# Patient Record
Sex: Male | Born: 1945 | Race: Black or African American | Hispanic: No | Marital: Married | Smoking: Never smoker
Health system: Southern US, Community
[De-identification: ages and names within clinical notes are randomized; demographics above are authoritative.]

## PROBLEM LIST (undated history)

## (undated) DIAGNOSIS — G47419 Narcolepsy without cataplexy: Secondary | ICD-10-CM

## (undated) DIAGNOSIS — Z9989 Dependence on other enabling machines and devices: Secondary | ICD-10-CM

## (undated) DIAGNOSIS — M1990 Unspecified osteoarthritis, unspecified site: Secondary | ICD-10-CM

## (undated) DIAGNOSIS — G4733 Obstructive sleep apnea (adult) (pediatric): Secondary | ICD-10-CM

## (undated) HISTORY — DX: Narcolepsy without cataplexy: G47.419

## (undated) HISTORY — DX: Obstructive sleep apnea (adult) (pediatric): G47.33

## (undated) HISTORY — PX: TONSILLECTOMY: SUR1361

## (undated) HISTORY — DX: Unspecified osteoarthritis, unspecified site: M19.90

## (undated) HISTORY — DX: Dependence on other enabling machines and devices: Z99.89

---

## 2007-06-09 DIAGNOSIS — G479 Sleep disorder, unspecified: Secondary | ICD-10-CM | POA: Insufficient documentation

## 2007-06-09 DIAGNOSIS — G47419 Narcolepsy without cataplexy: Secondary | ICD-10-CM | POA: Insufficient documentation

## 2007-06-09 DIAGNOSIS — G4733 Obstructive sleep apnea (adult) (pediatric): Secondary | ICD-10-CM | POA: Insufficient documentation

## 2007-06-10 ENCOUNTER — Ambulatory Visit: Payer: Self-pay | Admitting: Internal Medicine

## 2007-06-10 DIAGNOSIS — G4730 Sleep apnea, unspecified: Secondary | ICD-10-CM | POA: Insufficient documentation

## 2007-06-16 ENCOUNTER — Telehealth (INDEPENDENT_AMBULATORY_CARE_PROVIDER_SITE_OTHER): Payer: Self-pay | Admitting: *Deleted

## 2008-09-08 ENCOUNTER — Encounter: Payer: Self-pay | Admitting: Internal Medicine

## 2008-11-09 ENCOUNTER — Ambulatory Visit: Payer: Self-pay | Admitting: Internal Medicine

## 2008-12-22 ENCOUNTER — Telehealth: Payer: Self-pay | Admitting: Internal Medicine

## 2009-11-08 ENCOUNTER — Ambulatory Visit: Payer: Self-pay | Admitting: Internal Medicine

## 2010-05-31 NOTE — Assessment & Plan Note (Signed)
Summary: F/U 1 YR ////KP   Primary Provider/Referring Provider:  none  CC:  1 year follow up , pt has not used cpap for the last 6 months, wakes every 2 hrs at night, and states cpap was not helping.  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 Had sleep apnea dx'd 10/98 with NPSG AHI 18/hr. Uses Cpap autotitration. 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 Very irregular sleep habits. Used to have more sleep paralysis and cataplexy, but these have improved with time. Never tried Xyrem. Has  been on Disability, having lost many jobs. Always very irregular sleep schedule despite counseling on this. We tried a number of alerting meds but he said none of them ever seemed to work.  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 He can predict sleepy and alert times of day and he chooses when to drive so as to work around this.   11/09/08- OSA, narcolepsy w/o cataplexy Uses cpap if room is warm.. In the past he had failed to benefit from stimulant meds. He doesn't drive if he feels sleepy. Says mask is comfortable. Gets some care through [place]. Denies hx of epilepsy/ seizure, head trauma or stroke. Brngs DOT form- he denies sleepiness while driving. Will take occasional nap at home.  November 08, 2009- OSA, narcolepsy w/o cataplexy He comes for f/u saying he doesn't drive if he feels sleepy. He says he stopped CPAP 6 months ago, first saying it didn't help, then that he didn't really remember why he couldn't wear CPAP. His wife told him it did reduce snoring. He dropped off Nuvigil. He says he sits and "meditates" meaning he waits til alert enough to go forward. 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 I suggesested again that he go talk about  disability with [employer].He wakes for stretches of insomnia at night and continues with the irregular sleep habits he has always had.  Preventive Screening-Counseling & Management  Alcohol-Tobacco     Smoking Status: never  Current Medications (verified): 1)  Cpap 4 Advanced 2)  Ibuprofen 800 Mg Tabs (Ibuprofen) .... Take One Tablet By Mouth Once Daily 3)  Nuvigil 250 Mg Tabs (Armodafinil) .Marland Kitchen.. 1 Daily As Needed 4)  Vitamin B-12 100 Mcg Tabs (Cyanocobalamin) .Marland Kitchen.. 1 Every Other Day  Allergies (verified): No Known Drug Allergies  Past History:  Past Surgical History: Last updated: 11/09/2008 Tonsillectomy  Family History: Last updated: 06/10/2007 Father-cancer ? type Brother-prostate cancer  Social History: Last updated: 06/10/2007 Patient never smoked. Occassionally puffs on a cigar. Pt is married with children. Pt is a Proofreader by occupation.  Risk Factors: Smoking Status: never (11/08/2009)  Past Medical History: Sleep Apnea Narcolepsy w/o  cataplexy  Review of Systems      See HPI  The patient denies anorexia, fever, weight loss, weight gain, vision loss, decreased hearing, hoarseness, chest pain, syncope, dyspnea on exertion, peripheral edema, prolonged cough, headaches, hemoptysis, abdominal pain, melena, hematochezia, and severe indigestion/heartburn.    Vital Signs:  Patient profile:   65 year old male Height:      74 inches Weight:      359 pounds BMI:     46.26 O2 Sat:      92 % on Room air Pulse rate:   62 / minute BP sitting:  120 / 84  (left arm) Cuff size:   large  Vitals Entered By: Renold Genta RCP, LPN (November 08, 2009 10:14 AM)  O2 Sat at Rest %:  92% O2 Flow:  Room air CC: 1 year follow up , pt has not used cpap for the last 6 months, wakes every 2 hrs at night, states cpap was not helping Comments Medications reviewed with patient Renold Genta RCP, LPN  November 08, 2009 10:22 AM    Physical Exam  Additional Exam:  General:  A/Ox3; pleasant and cooperative, NAD, awake, interactive, casual but not sleepy once i walked into the room and started talking with him. SKIN: no rash, lesions NODES: no lymphadenopathy HEENT: [place]/AT, EOM- WNL, Conjuctivae- clear, PERRLA, TM-WNL, Nose- clear, Throat- clear and wnl, Mallampati III-IV, dental repair NECK: Supple w/ fair ROM, JVD- none, normal carotid impulses w/o bruits Thyroid- normal to palpation CHEST: Clear to P&A HEART: RRR, no m/g/r heard ABDOMEN: Soft and nl; , overweight JXB:JYNW, nl pulses, no edema  NEURO: Grossly intact to observation. No restlessness or tremor.      Impression & Recommendations:  Problem # 1:  HYPERSOMNIA, ASSOCIATED WITH SLEEP APNEA (ICD-780.53)  I pressed him to resume use of CPAP on a regular basis. He admits it reduces or stops snoring. He reports a speeding ticket but no other traffic violations.  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 We are going to give samples again for Nuvigil.  Medications Added to Medication List This Visit: 1)  Vitamin B-12 100 Mcg Tabs (Cyanocobalamin) .Marland Kitchen.. 1 every other day  Other Orders: Est. Patient Level III (29562)  Patient Instructions: 1)  Please schedule a follow-up appointment in 1 year. 2)  Please be very careful with your driving and if you aren't alert enough, then don't drive. 3)  I think you should be using your CPAP. if it isn't comfortable, please let us know and we will get it worked on for you. 4)  Try samples of Nuvigil 250 mg, once daily as an alerting med to wake you up. If it helps, then we can get you a script. 5)  Ask the Social Security office about disability.

## 2010-11-08 ENCOUNTER — Ambulatory Visit: Payer: Self-pay | Admitting: Internal Medicine

## 2011-06-05 ENCOUNTER — Emergency Department (INDEPENDENT_AMBULATORY_CARE_PROVIDER_SITE_OTHER): Payer: Self-pay

## 2011-06-05 ENCOUNTER — Encounter (HOSPITAL_COMMUNITY): Payer: Self-pay

## 2011-06-05 ENCOUNTER — Emergency Department (HOSPITAL_COMMUNITY): Payer: Self-pay

## 2011-06-05 ENCOUNTER — Ambulatory Visit (HOSPITAL_COMMUNITY)
Admission: EM | Admit: 2011-06-05 | Discharge: 2011-06-06 | Disposition: A | Discharge status: Home / Self Care | Payer: Self-pay | Attending: Orthopedic Surgery | Admitting: Orthopedic Surgery

## 2011-06-05 ENCOUNTER — Other Ambulatory Visit: Payer: Self-pay

## 2011-06-05 ENCOUNTER — Emergency Department (INDEPENDENT_AMBULATORY_CARE_PROVIDER_SITE_OTHER)
Admission: EM | Admit: 2011-06-05 | Discharge: 2011-06-05 | Disposition: A | Discharge status: Other Acute Inpatient Hospital | Payer: Self-pay | Source: Home / Self Care | Attending: Emergency Medicine | Admitting: Emergency Medicine

## 2011-06-05 ENCOUNTER — Encounter (HOSPITAL_COMMUNITY): Payer: Self-pay | Admitting: Anesthesiology

## 2011-06-05 ENCOUNTER — Encounter (HOSPITAL_COMMUNITY): Payer: Self-pay | Admitting: *Deleted

## 2011-06-05 ENCOUNTER — Encounter (HOSPITAL_COMMUNITY): Admission: EM | Disposition: A | Discharge status: Home / Self Care | Payer: Self-pay | Source: Home / Self Care

## 2011-06-05 ENCOUNTER — Emergency Department (HOSPITAL_COMMUNITY): Payer: Self-pay | Admitting: Anesthesiology

## 2011-06-05 DIAGNOSIS — S62639A Displaced fracture of distal phalanx of unspecified finger, initial encounter for closed fracture: Secondary | ICD-10-CM

## 2011-06-05 DIAGNOSIS — G4730 Sleep apnea, unspecified: Secondary | ICD-10-CM | POA: Insufficient documentation

## 2011-06-05 DIAGNOSIS — W298XXA Contact with other powered powered hand tools and household machinery, initial encounter: Secondary | ICD-10-CM | POA: Insufficient documentation

## 2011-06-05 DIAGNOSIS — S61411A Laceration without foreign body of right hand, initial encounter: Secondary | ICD-10-CM

## 2011-06-05 DIAGNOSIS — Y92009 Unspecified place in unspecified non-institutional (private) residence as the place of occurrence of the external cause: Secondary | ICD-10-CM | POA: Insufficient documentation

## 2011-06-05 DIAGNOSIS — S61409A Unspecified open wound of unspecified hand, initial encounter: Secondary | ICD-10-CM

## 2011-06-05 DIAGNOSIS — S61209A Unspecified open wound of unspecified finger without damage to nail, initial encounter: Secondary | ICD-10-CM | POA: Insufficient documentation

## 2011-06-05 DIAGNOSIS — G4733 Obstructive sleep apnea (adult) (pediatric): Secondary | ICD-10-CM | POA: Insufficient documentation

## 2011-06-05 DIAGNOSIS — S62639B Displaced fracture of distal phalanx of unspecified finger, initial encounter for open fracture: Secondary | ICD-10-CM | POA: Insufficient documentation

## 2011-06-05 DIAGNOSIS — Y998 Other external cause status: Secondary | ICD-10-CM | POA: Insufficient documentation

## 2011-06-05 DIAGNOSIS — IMO0002 Reserved for concepts with insufficient information to code with codable children: Secondary | ICD-10-CM | POA: Insufficient documentation

## 2011-06-05 DIAGNOSIS — G47419 Narcolepsy without cataplexy: Secondary | ICD-10-CM | POA: Insufficient documentation

## 2011-06-05 HISTORY — PX: ORIF FINGER FRACTURE: SHX2122

## 2011-06-05 HISTORY — PX: I & D EXTREMITY: SHX5045

## 2011-06-05 LAB — DIFFERENTIAL
Eosinophils Relative: 1 % (ref 0–5)
Lymphs Abs: 2.9 10*3/uL (ref 0.7–4.0)
Monocytes Absolute: 0.9 10*3/uL (ref 0.1–1.0)
Neutro Abs: 5.8 10*3/uL (ref 1.7–7.7)

## 2011-06-05 LAB — CBC
MCH: 22.9 pg — ABNORMAL LOW (ref 26.0–34.0)
MCV: 69.1 fL — ABNORMAL LOW (ref 78.0–100.0)
Platelets: 264 10*3/uL (ref 150–400)
RBC: 5.76 MIL/uL (ref 4.22–5.81)

## 2011-06-05 SURGERY — IRRIGATION AND DEBRIDEMENT EXTREMITY
Anesthesia: Regional | Site: Hand | Laterality: Right | Wound class: Contaminated

## 2011-06-05 MED ORDER — ONDANSETRON HCL 4 MG/2ML IJ SOLN
INTRAMUSCULAR | Status: DC | PRN
  Administered 2011-06-05: 4 mg via INTRAVENOUS

## 2011-06-05 MED ORDER — MIDAZOLAM HCL 2 MG/2ML IJ SOLN: 0.5000 mg | INTRAMUSCULAR | Status: DC | PRN

## 2011-06-05 MED ORDER — 0.9 % SODIUM CHLORIDE (POUR BTL) OPTIME
TOPICAL | Status: DC | PRN
  Administered 2011-06-05: 1000 mL

## 2011-06-05 MED ORDER — OXYCODONE HCL 5 MG PO TABS
5.0000 mg | ORAL_TABLET | ORAL | Status: DC | PRN
  Administered 2011-06-06: 10 mg via ORAL
  Filled 2011-06-05: qty 2

## 2011-06-05 MED ORDER — CEPHALEXIN 500 MG PO CAPS: 500.0000 mg | ORAL_CAPSULE | Freq: Four times a day (QID) | ORAL | Status: AC

## 2011-06-05 MED ORDER — ONDANSETRON HCL 4 MG/2ML IJ SOLN: 4.0000 mg | Freq: Four times a day (QID) | INTRAMUSCULAR | Status: DC | PRN

## 2011-06-05 MED ORDER — METOCLOPRAMIDE HCL 5 MG/ML IJ SOLN: 10.0000 mg | Freq: Once | INTRAMUSCULAR | Status: DC | PRN

## 2011-06-05 MED ORDER — ROPIVACAINE HCL 5 MG/ML IJ SOLN
INTRAMUSCULAR | Status: DC | PRN
  Administered 2011-06-05: 35 mL via EPIDURAL

## 2011-06-05 MED ORDER — PHENYLEPHRINE HCL 10 MG/ML IJ SOLN
INTRAMUSCULAR | Status: DC | PRN
  Administered 2011-06-05 (×2): 80 ug via INTRAVENOUS
  Administered 2011-06-05: 120 ug via INTRAVENOUS

## 2011-06-05 MED ORDER — VITAMIN C 500 MG PO TABS: 500.0000 mg | ORAL_TABLET | Freq: Every day | ORAL | Status: AC

## 2011-06-05 MED ORDER — MODAFINIL 200 MG PO TABS
200.0000 mg | ORAL_TABLET | Freq: Every day | ORAL | Status: DC
  Administered 2011-06-06: 200 mg via ORAL
  Filled 2011-06-05: qty 1

## 2011-06-05 MED ORDER — TETANUS-DIPHTH-ACELL PERTUSSIS 5-2.5-18.5 LF-MCG/0.5 IM SUSP
0.5000 mL | Freq: Once | INTRAMUSCULAR | Status: AC
  Administered 2011-06-05: 0.5 mL via INTRAMUSCULAR

## 2011-06-05 MED ORDER — OXYCODONE-ACETAMINOPHEN 10-325 MG PO TABS: 1.0000 | ORAL_TABLET | ORAL | Status: AC | PRN

## 2011-06-05 MED ORDER — HYDROCODONE-ACETAMINOPHEN 5-325 MG PO TABS
1.0000 | ORAL_TABLET | ORAL | Status: DC | PRN
  Administered 2011-06-06: 2 via ORAL
  Filled 2011-06-05: qty 2

## 2011-06-05 MED ORDER — LIDOCAINE HCL 1 % IJ SOLN
INTRAMUSCULAR | Status: DC | PRN
  Administered 2011-06-05: 2 mL via INTRADERMAL

## 2011-06-05 MED ORDER — TETANUS-DIPHTH-ACELL PERTUSSIS 5-2.5-18.5 LF-MCG/0.5 IM SUSP
INTRAMUSCULAR | Status: AC
  Filled 2011-06-05: qty 0.5

## 2011-06-05 MED ORDER — CEFTRIAXONE SODIUM 1 G IJ SOLR
1.0000 g | Freq: Once | INTRAMUSCULAR | Status: AC
  Administered 2011-06-05: 1 g via INTRAMUSCULAR

## 2011-06-05 MED ORDER — CEFTRIAXONE SODIUM 1 G IJ SOLR
INTRAMUSCULAR | Status: AC
  Filled 2011-06-05: qty 10

## 2011-06-05 MED ORDER — DOCUSATE SODIUM 100 MG PO CAPS
100.0000 mg | ORAL_CAPSULE | Freq: Two times a day (BID) | ORAL | Status: DC
  Administered 2011-06-06 (×2): 100 mg via ORAL
  Filled 2011-06-05 (×3): qty 1

## 2011-06-05 MED ORDER — KCL IN DEXTROSE-NACL 20-5-0.45 MEQ/L-%-% IV SOLN
INTRAVENOUS | Status: DC
  Administered 2011-06-05: 23:00:00 via INTRAVENOUS
  Filled 2011-06-05 (×2): qty 1000

## 2011-06-05 MED ORDER — VITAMIN C 500 MG PO TABS
1000.0000 mg | ORAL_TABLET | Freq: Every day | ORAL | Status: DC
  Administered 2011-06-06: 1000 mg via ORAL
  Filled 2011-06-05: qty 2

## 2011-06-05 MED ORDER — FENTANYL CITRATE 0.05 MG/ML IJ SOLN: 25.0000 ug | INTRAMUSCULAR | Status: DC | PRN

## 2011-06-05 MED ORDER — CEFAZOLIN SODIUM 1-5 GM-% IV SOLN
INTRAVENOUS | Status: AC
  Filled 2011-06-05: qty 100

## 2011-06-05 MED ORDER — CEFAZOLIN SODIUM 1-5 GM-% IV SOLN
1.0000 g | Freq: Three times a day (TID) | INTRAVENOUS | Status: DC
  Administered 2011-06-06 (×2): 1 g via INTRAVENOUS
  Filled 2011-06-05 (×4): qty 50

## 2011-06-05 MED ORDER — METHOCARBAMOL 100 MG/ML IJ SOLN: 500.0000 mg | Freq: Four times a day (QID) | INTRAMUSCULAR | Status: DC | PRN

## 2011-06-05 MED ORDER — LIDOCAINE HCL (PF) 1 % IJ SOLN
INTRAMUSCULAR | Status: AC
  Filled 2011-06-05: qty 5

## 2011-06-05 MED ORDER — VITAMIN B-12 100 MCG PO TABS
50.0000 ug | ORAL_TABLET | ORAL | Status: DC
  Administered 2011-06-06: 50 ug via ORAL
  Filled 2011-06-05: qty 1

## 2011-06-05 MED ORDER — EPHEDRINE SULFATE 50 MG/ML IJ SOLN
INTRAMUSCULAR | Status: DC | PRN
  Administered 2011-06-05 (×2): 10 mg via INTRAVENOUS

## 2011-06-05 MED ORDER — ONDANSETRON HCL 4 MG PO TABS: 4.0000 mg | ORAL_TABLET | Freq: Four times a day (QID) | ORAL | Status: DC | PRN

## 2011-06-05 MED ORDER — CEFAZOLIN SODIUM 1-5 GM-% IV SOLN
1.0000 g | INTRAVENOUS | Status: AC
  Administered 2011-06-06: 1 g via INTRAVENOUS
  Filled 2011-06-05: qty 50

## 2011-06-05 MED ORDER — ARMODAFINIL 250 MG PO TABS: 250.0000 mg | ORAL_TABLET | Freq: Every day | ORAL | Status: DC

## 2011-06-05 MED ORDER — MORPHINE SULFATE 4 MG/ML IJ SOLN: 0.0500 mg/kg | INTRAMUSCULAR | Status: DC | PRN

## 2011-06-05 MED ORDER — LACTATED RINGERS IV SOLN
INTRAVENOUS | Status: DC | PRN
  Administered 2011-06-05 (×2): via INTRAVENOUS

## 2011-06-05 MED ORDER — DIPHENHYDRAMINE HCL 25 MG PO CAPS: 25.0000 mg | ORAL_CAPSULE | Freq: Four times a day (QID) | ORAL | Status: DC | PRN

## 2011-06-05 MED ORDER — PROPOFOL 10 MG/ML IV EMUL
INTRAVENOUS | Status: DC | PRN
  Administered 2011-06-05: 100 mg via INTRAVENOUS
  Administered 2011-06-05: 200 mg via INTRAVENOUS

## 2011-06-05 MED ORDER — CEFAZOLIN SODIUM 1-5 GM-% IV SOLN
INTRAVENOUS | Status: DC | PRN
  Administered 2011-06-05: 2 g via INTRAVENOUS

## 2011-06-05 MED ORDER — ALPRAZOLAM 0.5 MG PO TABS: 0.5000 mg | ORAL_TABLET | Freq: Four times a day (QID) | ORAL | Status: DC | PRN

## 2011-06-05 MED ORDER — MORPHINE SULFATE 2 MG/ML IJ SOLN: 1.0000 mg | INTRAMUSCULAR | Status: DC | PRN

## 2011-06-05 MED ORDER — FENTANYL CITRATE 0.05 MG/ML IJ SOLN: 50.0000 ug | INTRAMUSCULAR | Status: DC | PRN

## 2011-06-05 MED ORDER — METHOCARBAMOL 500 MG PO TABS: 500.0000 mg | ORAL_TABLET | Freq: Four times a day (QID) | ORAL | Status: DC | PRN

## 2011-06-05 MED ORDER — METOCLOPRAMIDE HCL 5 MG/ML IJ SOLN
INTRAMUSCULAR | Status: DC | PRN
  Administered 2011-06-05: 20 mg via INTRAVENOUS

## 2011-06-05 SURGICAL SUPPLY — 61 items
BANDAGE CONFORM 2  STR LF (GAUZE/BANDAGES/DRESSINGS) ×2 IMPLANT
BANDAGE CONFORM 2X5YD N/S (GAUZE/BANDAGES/DRESSINGS) ×1 IMPLANT
BANDAGE ELASTIC 3 VELCRO ST LF (GAUZE/BANDAGES/DRESSINGS) ×1 IMPLANT
BANDAGE ELASTIC 4 VELCRO ST LF (GAUZE/BANDAGES/DRESSINGS) IMPLANT
BANDAGE GAUZE ELAST BULKY 4 IN (GAUZE/BANDAGES/DRESSINGS) IMPLANT
BNDG CMPR 9X4 STRL LF SNTH (GAUZE/BANDAGES/DRESSINGS) ×2
BNDG CMPR MD 5X2 ELC HKLP STRL (GAUZE/BANDAGES/DRESSINGS) ×2
BNDG COHESIVE 1X5 TAN STRL LF (GAUZE/BANDAGES/DRESSINGS) ×3 IMPLANT
BNDG ELASTIC 2 VLCR STRL LF (GAUZE/BANDAGES/DRESSINGS) ×3 IMPLANT
BNDG ESMARK 4X9 LF (GAUZE/BANDAGES/DRESSINGS) ×3 IMPLANT
CAP PIN ORTHO PINK (CAP) IMPLANT
CAP PIN PROTECTOR ORTHO WHT (CAP) IMPLANT
CLOTH BEACON ORANGE TIMEOUT ST (SAFETY) ×3 IMPLANT
CORDS BIPOLAR (ELECTRODE) ×3 IMPLANT
COVER SURGICAL LIGHT HANDLE (MISCELLANEOUS) ×3 IMPLANT
CUFF TOURNIQUET SINGLE 18IN (TOURNIQUET CUFF) ×2 IMPLANT
CUFF TOURNIQUET SINGLE 24IN (TOURNIQUET CUFF) ×1 IMPLANT
DRAPE OEC MINIVIEW 54X84 (DRAPES) ×1 IMPLANT
DRAPE SURG 17X23 STRL (DRAPES) ×3 IMPLANT
DRSG ADAPTIC 3X8 NADH LF (GAUZE/BANDAGES/DRESSINGS) ×1 IMPLANT
GAUZE SPONGE 2X2 8PLY STRL LF (GAUZE/BANDAGES/DRESSINGS) IMPLANT
GAUZE XEROFORM 1X8 LF (GAUZE/BANDAGES/DRESSINGS) ×3 IMPLANT
GLOVE BIOGEL PI IND STRL 8.5 (GLOVE) ×2 IMPLANT
GLOVE BIOGEL PI INDICATOR 8.5 (GLOVE) ×1
GLOVE BIOGEL PI ORTHO PRO SZ7 (GLOVE) ×1
GLOVE PI ORTHO PRO STRL SZ7 (GLOVE) IMPLANT
GLOVE SS BIOGEL STRL SZ 6.5 (GLOVE) IMPLANT
GLOVE SUPERSENSE BIOGEL SZ 6.5 (GLOVE) ×1
GLOVE SURG ORTHO 8.0 STRL STRW (GLOVE) ×3 IMPLANT
GOWN PREVENTION PLUS XLARGE (GOWN DISPOSABLE) ×3 IMPLANT
GOWN STRL NON-REIN LRG LVL3 (GOWN DISPOSABLE) ×6 IMPLANT
K-WIRE SMTH SNGL TROCAR .028X4 (WIRE)
KIT BASIN OR (CUSTOM PROCEDURE TRAY) ×3 IMPLANT
KIT ROOM TURNOVER OR (KITS) ×3 IMPLANT
KWIRE 4.0 X .045IN (WIRE) ×2 IMPLANT
KWIRE SMTH SNGL TROCAR .028X4 (WIRE) IMPLANT
MANIFOLD NEPTUNE II (INSTRUMENTS) ×3 IMPLANT
NDL HYPO 25GX1X1/2 BEV (NEEDLE) IMPLANT
NEEDLE HYPO 25GX1X1/2 BEV (NEEDLE) ×3 IMPLANT
NS IRRIG 1000ML POUR BTL (IV SOLUTION) ×3 IMPLANT
PACK ORTHO EXTREMITY (CUSTOM PROCEDURE TRAY) ×3 IMPLANT
PAD ARMBOARD 7.5X6 YLW CONV (MISCELLANEOUS) ×6 IMPLANT
PAD CAST 4YDX4 CTTN HI CHSV (CAST SUPPLIES) IMPLANT
PADDING CAST COTTON 4X4 STRL (CAST SUPPLIES) ×6
PADDING UNDERCAST 2  STERILE (CAST SUPPLIES) ×3 IMPLANT
SOAP 2 % CHG 4 OZ (WOUND CARE) ×3 IMPLANT
SPLINT FINGER W/BULB (SOFTGOODS) ×2 IMPLANT
SPONGE GAUZE 2X2 STER 10/PKG (GAUZE/BANDAGES/DRESSINGS)
SPONGE GAUZE 4X4 12PLY (GAUZE/BANDAGES/DRESSINGS) ×1 IMPLANT
SUCTION FRAZIER TIP 10 FR DISP (SUCTIONS) IMPLANT
SUT CHROMIC 5 0 P 3 (SUTURE) ×1 IMPLANT
SUT ETHIBOND 4 0 TF (SUTURE) ×1 IMPLANT
SUT MERSILENE 4 0 P 3 (SUTURE) IMPLANT
SUT PROLENE 4 0 PS 2 18 (SUTURE) ×3 IMPLANT
SUT PROLENE 5 0 P 3 (SUTURE) ×1 IMPLANT
SYR CONTROL 10ML LL (SYRINGE) IMPLANT
TOWEL OR 17X24 6PK STRL BLUE (TOWEL DISPOSABLE) ×3 IMPLANT
TOWEL OR 17X26 10 PK STRL BLUE (TOWEL DISPOSABLE) ×3 IMPLANT
TUBE CONNECTING 12X1/4 (SUCTIONS) IMPLANT
UNDERPAD 30X30 INCONTINENT (UNDERPADS AND DIAPERS) ×3 IMPLANT
WATER STERILE IRR 1000ML POUR (IV SOLUTION) ×3 IMPLANT

## 2011-06-05 NOTE — H&P (Signed)
Ian James is an 66 y.o. male.   Chief Complaint: RIGHT HAND CIRCULAR SAW INJURY HPI: PT AT HOME USING SAW SUSTAINED OPEN INJURY TO RIGHT HAND SEEN IN ED AND RECOMMENDED TO UNDERGO REPAIR OF COMPLEX LACERATIONS AND INJURIES TO HAND.   Past Medical History  Diagnosis Date  . OSA on CPAP   . Narcolepsy without cataplexy     Past Surgical History  Procedure Date  . Tonsillectomy     Family History  Problem Relation Age of Onset  . Cancer Father   . Prostate cancer Brother    Social History:  reports that he has never smoked. He does not have any smokeless tobacco history on file. His alcohol and drug histories not on file.  Allergies: No Known Allergies  Medications Prior to Admission  Medication Dose Route Frequency Provider Last Rate Last Dose  . cefTRIAXone (ROCEPHIN) injection 1 g  1 g Intramuscular Once Roque Lias, MD   1 g at 06/05/11 1231  . lactated ringers infusion    Continuous PRN Glendora Score, CRNA      . lidocaine (XYLOCAINE) 1 % (with pres) injection    PRN Constance Goltz, MD   2 mL at 06/05/11 1934  . ropivacaine (PF) (NAROPIN) injection    PRN Constance Goltz, MD   35 mL at 06/05/11 1937  . TDaP (BOOSTRIX) injection 0.5 mL  0.5 mL Intramuscular Once Roque Lias, MD   0.5 mL at 06/05/11 1125   Medications Prior to Admission  Medication Sig Dispense Refill  . Armodafinil (NUVIGIL) 250 MG tablet Take 250 mg by mouth daily.        Marland Kitchen ibuprofen (ADVIL,MOTRIN) 800 MG tablet Take 800 mg by mouth daily.        . vitamin B-12 (CYANOCOBALAMIN) 100 MCG tablet Take 50 mcg by mouth every other day.          Results for orders placed during the hospital encounter of 06/05/11 (from the past 48 hour(s))  CBC     Status: Abnormal   Collection Time   06/05/11  1:30 PM      Component Value Range Comment   WBC 9.8  4.0 - 10.5 (K/uL)    RBC 5.76  4.22 - 5.81 (MIL/uL)    Hemoglobin 13.2  13.0 - 17.0 (g/dL)    HCT 16.1  09.6 - 04.5 (%)    MCV 69.1 (*) 78.0 - 100.0 (fL)    MCH 22.9 (*) 26.0 - 34.0 (pg)    MCHC 33.2  30.0 - 36.0 (g/dL)    RDW 40.9 (*) 81.1 - 15.5 (%)    Platelets 264  150 - 400 (K/uL)   DIFFERENTIAL     Status: Normal   Collection Time   06/05/11  1:30 PM      Component Value Range Comment   Neutrophils Relative 59  43 - 77 (%)    Lymphocytes Relative 30  12 - 46 (%)    Monocytes Relative 9  3 - 12 (%)    Eosinophils Relative 1  0 - 5 (%)    Basophils Relative 1  0 - 1 (%)    Neutro Abs 5.8  1.7 - 7.7 (K/uL)    Lymphs Abs 2.9  0.7 - 4.0 (K/uL)    Monocytes Absolute 0.9  0.1 - 1.0 (K/uL)    Eosinophils Absolute 0.1  0.0 - 0.7 (K/uL)    Basophils Absolute 0.1  0.0 - 0.1 (K/uL)  RBC Morphology TARGET CELLS   TEARDROP CELLS   WBC Morphology ATYPICAL LYMPHOCYTES      Dg Chest 2 View  06/05/2011  *RADIOLOGY REPORT*  Clinical Data: Preop, laceration right hand injury  CHEST - 2 VIEW  Comparison: None.  Findings: Normal mediastinum and cardiac silhouette with ectatic aorta.  Chronic bronchitic markings centrally.  No effusion, infiltrate, pneumothorax. Degenerative osteophytosis of the thoracic spine.  IMPRESSION: No acute findings.  Bronchitic change.  Original Report Authenticated By: Genevive Bi, M.D.   Dg Hand Complete Right  06/05/2011  *RADIOLOGY REPORT*  Clinical Data: Cough, right index finger with a saw  RIGHT HAND - COMPLETE 3+ VIEW  Comparison: None  Findings: Osseous mineralization normal. Minimal scattered narrowing of interphalangeal joints. Displaced fracture through distal phalanx index finger with associated soft tissue deformity. Degenerative changes first CMC joint. Minimal degenerative changes at IP joint of the right thumb. Soft tissue irregularity at dorsal aspect right thumb at the level of the IP joint. Lucency is seen at the base of the distal phalanx right thumb though a definite distal phalangeal fracture is not identified; suspect lucency represents a prominent fossa at the base of the  distal phalanx, normal variant.  IMPRESSION: Displaced fracture and partial amputation through distal phalanx right index finger. No definite right thumb fracture identified.  Original Report Authenticated By: Lollie Marrow, M.D.    NO RECENT ILLNESSES  Blood pressure 147/92, pulse 90, temperature 98.2 F (36.8 C), temperature source Oral, resp. rate 14, height 6\' 2"  (1.88 m), weight 149.687 kg (330 lb), SpO2 97.00%. General Appearance:  Alert, cooperative, no distress, appears stated age  Head:  Normocephalic, without obvious abnormality, atraumatic  Eyes:  Pupils equal, conjunctiva/corneas clear,         Throat: Lips, mucosa, and tongue normal; teeth and gums normal  Neck: No visible masses     Lungs:   respirations unlabored  Chest Wall:  No tenderness or deformity  Heart:  Regular rate and rhythm,  Abdomen:   Soft, non-tender,         Extremities: RIGHT HAND IN BANDAGE NOT REMOVED IN PREOP AREA. PT' S HAND TO BE EXAMINED IN OR  Pulses: 2+ and symmetric  Skin: Skin color, texture, turgor normal, no rashes or lesions     Neurologic: Normal    Assessment/Plan RIGHT HAND CIRCULAR SAW INJURY WITH OPEN PHALANGEAL FRACTURES AND LACERATIONS  RIGHT HAND OPEN DEBRIDEMENT AND OPEN REDUCTION INTERNAL FIXATION REPAIR AS INDICATED  R/B/A DISCUSSED WITH PT IN HOLDING AREA.  PT VOICED UNDERSTANDING OF PLAN CONSENT SIGNED DAY OF SURGERY PT SEEN AND EXAMINED PRIOR TO OPERATIVE PROCEDURE/DAY OF SURGERY SITE MARKED. QUESTIONS ANSWERED WILL BE AN INPATIENT/OBS PATIENT FOLLOWING SURGERY  Sharma Covert 06/05/2011, 9:02 PM

## 2011-06-05 NOTE — Anesthesia Preprocedure Evaluation (Addendum)
Anesthesia Evaluation  Patient identified by MRN, date of birth, ID band Patient awake    Reviewed: Allergy & Precautions, H&P , NPO status , Patient's Chart, lab work & pertinent test results, reviewed documented beta blocker date and time   Airway Mallampati: II TM Distance: >3 FB Neck ROM: full    Dental  (+) Dental Advidsory Given   Pulmonary sleep apnea and Continuous Positive Airway Pressure Ventilation ,          Cardiovascular neg cardio ROS     Neuro/Psych Negative Neurological ROS  Negative Psych ROS   GI/Hepatic negative GI ROS, Neg liver ROS,   Endo/Other  Morbid obesity  Renal/GU negative Renal ROS  Genitourinary negative   Musculoskeletal   Abdominal   Peds  Hematology negative hematology ROS (+)   Anesthesia Other Findings See surgeon's H&P   Reproductive/Obstetrics negative OB ROS                          Anesthesia Physical Anesthesia Plan  ASA: III  Anesthesia Plan: General   Post-op Pain Management: MAC Combined w/ Regional for Post-op pain   Induction: Intravenous  Airway Management Planned: LMA  Additional Equipment:   Intra-op Plan:   Post-operative Plan:   Informed Consent: I have reviewed the patients History and Physical, chart, labs and discussed the procedure including the risks, benefits and alternatives for the proposed anesthesia with the patient or authorized representative who has indicated his/her understanding and acceptance.   Dental Advisory Given  Plan Discussed with: CRNA and Surgeon  Anesthesia Plan Comments:       Anesthesia Quick Evaluation

## 2011-06-05 NOTE — ED Provider Notes (Signed)
3:27 PM Pt seen and examined by me in CDU. Pt with laceration to right hand with a Skill Saw. Pt with bleeding to right hand. Here awaiting For Dr. Orlan Leavens. Pt states his pain is currently under control. He is in NAD. Right hand with gauze dressing, some bleeding seen through the dressing. Lungs clear to auscultation. Regular hr and rhythm. Will continue to monitor pt until taken to OR.  Lottie Mussel, PA 06/07/11 0020

## 2011-06-05 NOTE — ED Provider Notes (Signed)
Chief Complaint  Patient presents with  . Hand Injury    History of Present Illness:  The patient injured his right hand while working with a skill saw this morning at 10 AM. He has multiple lacerations involving his right hand, most notably the right index finger at the EIP joint, he also has a laceration over the dorsum of the CP joint of the thumb. He is able to extend the thumb, but he can't move the index finger at all. He also has a number of other superficial lacerations as well. He has no feeling in the finger he does have feeling in the thumb and all the other digits.  Review of Systems:  Other than noted above, the patient denies any of the following symptoms: Systemic:  No fevers, chills, sweats, or aches.  No fatigue or tiredness. Musculoskeletal:  No joint pain, arthritis, bursitis, swelling, back pain, or neck pain. Neurological:  No muscular weakness, paresthesias, headache, or trouble with speech or coordination.  No dizziness.   PMFSH:  Past medical history, family history, social history, meds, and allergies were reviewed.  Physical Exam:   Vital signs:  BP 172/97  Pulse 85  Temp(Src) 97.6 F (36.4 C) (Oral)  Resp 20  SpO2 99% Gen:  Alert and oriented times 3.  In no distress. Musculoskeletal: The laceration to the index finger is the most severe, this goes about three fourths of the way through the finger, involving the bone, and I can take the tip of the finger and moving around at will. He has no feeling in the tip of the finger. There is also a fairly deep laceration over the MCP joint of the thumb, but he is able to extend the thumb. He has a number other more superficial lacerations as well. Otherwise, all joints had a full a ROM with no swelling, bruising or deformity.  No edema, pulses full. Extremities were warm and pink.  Capillary refill was brisk.  Skin:  Clear, warm and dry.  No rash. Neuro:  Alert and oriented times 3.  Muscle strength was normal.  Sensation  was intact to light touch.   Labs:  No results found for this or any previous visit.   Radiology:  Dg Hand Complete Right  06/05/2011  *RADIOLOGY REPORT*  Clinical Data: Cough, right index finger with a saw  RIGHT HAND - COMPLETE 3+ VIEW  Comparison: None  Findings: Osseous mineralization normal. Minimal scattered narrowing of interphalangeal joints. Displaced fracture through distal phalanx index finger with associated soft tissue deformity. Degenerative changes first CMC joint. Minimal degenerative changes at IP joint of the right thumb. Soft tissue irregularity at dorsal aspect right thumb at the level of the IP joint. Lucency is seen at the base of the distal phalanx right thumb though a definite distal phalangeal fracture is not identified; suspect lucency represents a prominent fossa at the base of the distal phalanx, normal variant.  IMPRESSION: Displaced fracture and partial amputation through distal phalanx right index finger. No definite right thumb fracture identified.  Original Report Authenticated By: Lollie Marrow, M.D.    Medications given in UCC:  He was given a tetanus vaccine and Rocephin 1 g IM.  Assessment:   Diagnoses that have been ruled out:  None  Diagnoses that are still under consideration:  None  Final diagnoses:  Laceration of right hand with complication    Plan:   1.  the patient was sent by shuttle to the clinical decision unit at the  emergency department of the hospital. He'll be met there by Dr. Bradly Bienenstock who will handle his treatment from there.  Roque Lias, MD 06/05/11 1329

## 2011-06-05 NOTE — ED Notes (Addendum)
aprox 10:30 AM, pt was working w a skilll saw, when the saw kicked back n him, causing saw injury to all 5 fingers right hand (pt let handed). Superficial lact to 5 th finger, 3rd fingers; deep laceration to 4th finger.  Deep  Laceration to thumb, near complete amputation to index finger tip; minimal bleeding at present. States he had never had a saw kick back on him in all his yrs of use. No other injury noted

## 2011-06-05 NOTE — ED Provider Notes (Signed)
1:15 PM MSE. Pt to CDU while awaiting definitive care from Dr Melvyn Novas. Lacerations to all digits of right hand with a circular saw around 10:30 AM this morning. Patient is left-hand dominant. A&O x 3. No acute distress. Lungs clear to auscultation bilaterally. Heart with regular rate and rhythm without murmur. Abdomen is soft, nontender nondistended. There is a bulky dressing in place to the right hand. Superficial, blood soaked dressings are removed. There is gauze dressing over the effected digits that is stuck to the fingers with clotted blood-these are now removed to avoid disrupting any communication of the lacerated parts. A new dressing is placed by the nursing staff. Patient voices no needs at this time. Dr. Melvyn Novas notified of patient's arrival by CDU nursing staff.  Shaaron Adler, [place] 06/05/11 [phone]

## 2011-06-05 NOTE — ED Notes (Signed)
Pt will transfer to CDU where dr. Renetta Chalk will come to see him

## 2011-06-05 NOTE — ED Notes (Signed)
SPOKE WITH DR Melvyn Novas. HE DOES NOT WANT FURTHER ATB.

## 2011-06-05 NOTE — ED Notes (Signed)
Report called to cindy in or. She advises pt wife can come back with him to holding area

## 2011-06-05 NOTE — Anesthesia Procedure Notes (Addendum)
Anesthesia Regional Block:  Axillary brachial plexus block  Pre-Anesthetic Checklist: ,, timeout performed, Correct Patient, Correct Site, Correct Laterality, Correct Procedure, Correct Position, site marked, Risks and benefits discussed,  Surgical consent,  Pre-op evaluation,  At surgeon's request and post-op pain management  Laterality: Right  Prep: chloraprep       Needles:   Needle Type: Other   (Arrow Echogenic)   Needle Length: 9cm  Needle Gauge: 21    Additional Needles:  Procedures: ultrasound guided Axillary brachial plexus block Narrative:  Start time: 06/05/2011 7:33 PM End time: 06/05/2011 7:40 PM Injection made incrementally with aspirations every 5 mL.  Performed by: Personally  Anesthesiologist: Aldona Lento, MD  Additional Notes: Ultrasound guidance used to: id relevant anatomy, confirm needle position, local anesthetic spread, avoidance of vascular puncture. Picture saved. No complications. Block performed personally by Janetta Hora. Gelene Mink, MD    Interscalene brachial plexus block Procedure Name: LMA Insertion Date/Time: 06/05/2011 9:09 PM Performed by: Glendora Score Pre-anesthesia Checklist: Patient identified, Emergency Drugs available, Suction available and Patient being monitored Patient Re-evaluated:Patient Re-evaluated prior to inductionOxygen Delivery Method: Circle System Utilized Preoxygenation: Pre-oxygenation with 100% oxygen Intubation Type: IV induction LMA: LMA with gastric port inserted LMA Size: 5.0 Placement Confirmation: positive ETCO2 and breath sounds checked- equal and bilateral Tube secured with: Tape Dental Injury: Teeth and Oropharynx as per pre-operative assessment

## 2011-06-05 NOTE — Transfer of Care (Signed)
Immediate Anesthesia Transfer of Care Note  Patient: Ian James  Procedure(s) Performed:  OPEN REDUCTION INTERNAL FIXATION (ORIF) METACARPAL (FINGER) FRACTURE - ORIF Right Index Finger with Repair of Lacerations; IRRIGATION AND DEBRIDEMENT EXTREMITY  Patient Location: PACU  Anesthesia Type: General and Regional  Level of Consciousness: awake, alert , oriented and patient cooperative  Airway & Oxygen Therapy: Patient Spontanous Breathing and Patient connected to face mask oxygen  Post-op Assessment: Report given to PACU RN  Post vital signs: Reviewed and stable  Complications: No apparent anesthesia complications

## 2011-06-05 NOTE — ED Notes (Signed)
Pt sent from [facility] for surgery.  Pt has multiple deep hand lacerations to right hand from a injury this morning with a skills saw and was sent from [facility] to be seen by a hand surgeon (per pt report).  Pt has dressing to right hand.  Pt continues to have some bleeding which has soaked through dressing to right hand.

## 2011-06-05 NOTE — Preoperative (Signed)
Beta Blockers   Reason not to administer Beta Blockers:Not Applicable 

## 2011-06-05 NOTE — Anesthesia Postprocedure Evaluation (Signed)
Anesthesia Post Note  Patient: Ian James  Procedure(s) Performed:  OPEN REDUCTION INTERNAL FIXATION (ORIF) METACARPAL (FINGER) FRACTURE - ORIF Right Index Finger with Repair of Lacerations; IRRIGATION AND DEBRIDEMENT EXTREMITY  Anesthesia type: general  Patient location: PACU  Post pain: Pain level controlled  Post assessment: Patient's Cardiovascular Status Stable  Last Vitals:  Filed Vitals:   06/05/11 2200  BP:   Pulse: 87  Temp: 36.1 C  Resp: 15    Post vital signs: Reviewed and stable  Level of consciousness: sedated  Complications: No apparent anesthesia complications

## 2011-06-05 NOTE — Brief Op Note (Signed)
06/05/2011  10:19 PM  PATIENT:  Ian James  66 y.o. male  PRE-OPERATIVE DIAGNOSIS:  Right Hand Index Finger Injury  POST-OPERATIVE DIAGNOSIS:  Saw injury to multiple finger on right hand  PROCEDURE: I/D RIGHT THUMB AND INDEX AND ORIF RIGHT INDEX  SURGEON:  Surgeon(s): Sharma Covert, MD  PHYSICIAN ASSISTANT:   ASSISTANTS: none   ANESTHESIA:   general  EBL:  Total I/O In: 1800 [I.V.:1800] Out: -   BLOOD ADMINISTERED:none  DRAINS: none   LOCAL MEDICATIONS USED:  NONE  SPECIMEN:  No Specimen  DISPOSITION OF SPECIMEN:  N/A  COUNTS:  YES  TOURNIQUET:   Total Tourniquet Time Documented: Upper Arm (Right) - 29 minutes  DICTATION: .Other Dictation: Dictation Number [id number]  PLAN OF CARE: Admit for overnight observation  PATIENT DISPOSITION:  PACU - hemodynamically stable.   Delay start of Pharmacological VTE agent (>24hrs) due to surgical blood loss or risk of bleeding:  {YES/NO/NOT APPLICABLE:20182

## 2011-06-06 NOTE — Op Note (Signed)
Ian James, Ian James                      ACCOUNT NO.:  MEDICAL RECORD NO.:  [id number]  LOCATION:                                 FACILITY:  PHYSICIAN:  Madelynn Done, MD  DATE OF BIRTH:  1946-02-02  DATE OF PROCEDURE:  06/05/2011 DATE OF DISCHARGE:                              OPERATIVE REPORT   PREOPERATIVE DIAGNOSES: 1. Right thumb table saw injury. 2. Right index finger table saw injury with open distal phalanx     fracture. 3. Right ring finger table saw injuries with laceration.  POSTOPERATIVE DIAGNOSES: 1. Right thumb table saw injury. 2. Right index finger table saw injury with open distal phalanx     fracture. 3. Right ring finger table saw injuries with laceration.  ATTENDING PHYSICIAN:  Madelynn Done, MD, who scrubbed and present for the entire procedure.  ASSISTANT SURGEON:  None.  SURGICAL PROCEDURES: 1. Right thumb debridement of skin, subcutaneous tissue, and tendon     excisional debridement. 2. Right thumb extensor tendon repair. 3. Right index finger debridement of skin, subcutaneous tissue, and     bone associated with open fracture. 4. Right index finger open repair of distal phalangeal fracture with     internal fixation. 5. Right index finger complicated and traumatic laceration, 4 cm. 6. Right index finger nail bed repair. 7. Radiographs, 2 views, right index finger. 8. Right ring finger simple transverse laceration repair, less than 2     cm.  SURGICAL IMPLANTS:  One 0.045 K-wire.  SURGICAL INDICATIONS:  Mr. Mussa is a right-hand-dominant gentleman who sustained a table saw injury to his right dominant hand.  The patient is seen and evaluated and recommended to undergo the above procedure. Risks, benefits, and alternatives were discussed in detail with the patient and a signed informed consent was obtained.  Risks include but not limited to bleeding, infection, damage to nearby nerves, arteries, or tendons, loss of motion of the elbow,  wrist, and digits, and need for further surgical intervention.  DESCRIPTION OF PROCEDURE:  The patient was properly identified in the preop holding area and a mark with permanent marker was made on the right hand to indicate the correct operative site.  The patient was then brought back to the operating room and placed supine on anesthesia room table.  General anesthesia was administered.  The patient tolerated this well.  A well-padded tourniquet was then placed in the right brachium and sealed with 1000 drape.  Right upper extremity was then prepped and draped in a normal sterile fashion.  A time-out was called, the correct site was identified, and procedure was then begun.  Attention was then turned to the right hand where the thumb was then approached.  The sharp laceration extending over the IP joint was then extended both proximally and distally.  The excisional debridement of the skin, subcutaneous tissue, and tendon was then carried out removing all devitalized tissue. After excisional debridement was done sharply with a knife, the patient did have a partial laceration of the extensor tendon.  This laceration was then repaired with a simple 4-0 Ethibond figure-of-eight sutures. The wound was then  repaired.  After repairing the extensor tendon, the wound was then thoroughly irrigated and then laceration was then closed using simple Prolene sutures.  Attention was then turned to the ring finger where the patient did have a 2-cm simple laceration that was thoroughly irrigated.  Skin edges were debrided and laceration repaired with simple Prolene sutures.  The attention was then turned to the index finger where excisional debridement of the skin, subcutaneous tissue, and bone was then carried out of the open fracture.  The patient had a very distal tip injury.  The skin over the distal tip was being perfused through the radial neurovascular bundle.  Once the excisional debris  was then carried out sharply down with knifes, rongeur, and sharp scissors, a 0.045 K-wire was then placed antegrade and then back retrograde across the distal interphalangeal joint holding the fracture site well aligned. After internal fixation, the nail bed was then repaired with 5-0 chromic suture.  Following this, complicated and traumatic laceration was then repaired.  Skin flaps were then excised slightly and rotated and the skin was then closed using simple Prolene sutures.  The K-wire was then cut and bent and left out of the skin.  The Adaptic dressings were applied over the wounds.  Sterile compressive bandage was then applied. Final radiographs were obtained of the finger.  The patient was then put in a small finger dressing, extubated, and taken to the recovery room in good condition.  Intraoperative radiographs 2 views of the fingers do show the internal fixation in place and there was good position in both planes.  POSTOPERATIVE PLAN:  The patient is admitted for IV antibiotics and pain control, discharged in the morning, seen back in the office in approximately 6 days for wound check.  Tip protector splint for the index finger and gradually use to the long and the ring and the thumb. Sutures out at 2-week mark.  X-rays at each visit of the index finger. Pin out at the 4-week mark and then begin some active range of motion of the distal interphalangeal joint, the index finger at the 4-week mark.     Madelynn Done, MD     FWO/MEDQ  D:  06/05/2011  T:  06/06/2011  Job:  [id number]

## 2011-06-06 NOTE — ED Provider Notes (Signed)
Medical screening examination/treatment/procedure(s) were conducted as a shared visit with non-physician practitioner(s) and myself.  I personally evaluated the patient during the encounter  Shemeka Wardle T Prestyn Mahn, MD 06/06/11 0925 

## 2011-06-07 ENCOUNTER — Encounter (HOSPITAL_COMMUNITY): Payer: Self-pay | Admitting: Orthopedic Surgery

## 2011-06-07 NOTE — ED Provider Notes (Signed)
Medical screening examination/treatment/procedure(s) were performed by non-physician practitioner and as supervising physician I was immediately available for consultation/collaboration.  Gerhard Munch, MD 06/07/11 Jacinta Shoe

## 2012-05-14 ENCOUNTER — Encounter: Payer: Self-pay | Admitting: Internal Medicine

## 2012-05-14 ENCOUNTER — Ambulatory Visit (INDEPENDENT_AMBULATORY_CARE_PROVIDER_SITE_OTHER): Payer: Medicare Other | Admitting: Internal Medicine

## 2012-05-14 VITALS — BP 130/80 | HR 77 | Ht 74.0 in | Wt 358.2 lb

## 2012-05-14 DIAGNOSIS — G4733 Obstructive sleep apnea (adult) (pediatric): Secondary | ICD-10-CM

## 2012-05-14 DIAGNOSIS — G47419 Narcolepsy without cataplexy: Secondary | ICD-10-CM

## 2012-05-14 NOTE — Patient Instructions (Addendum)
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   Weight loss would help your sleep problems and your knees  Ok to continue modafanil through the [place]

## 2012-05-14 NOTE — Progress Notes (Signed)
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 FOLLOWS FOR: last seen 11-08-09;Retired now-Not using CPAP; patient states he never really sleeps-just catnapping at times. He does use CPAP/[facility] when is not bothered by nasal stuffiness and this varies. Provigil/ modafanil is prescribed through the [place], taking one half pill daily when needed.He uses this for driving to be sure he is alert. Episodes of sleepiness about once a day, lasting 2 or 3 minutes. Occasional sleep paralysis. No effect of caffeine. He works around intervals of sleepiness and is scrupulous about avoiding driving when he feels sleepy. Has not had cataplexy in many years. He brings a DOT form for driving continuity today and we discussed this carefully. He understands that I must take his assessment at face value and that it is his responsibility to drive safely or not drive at all.  ROS-see HPI Constitutional:   No-   weight loss, night sweats, fevers, chills, +fatigue, lassitude. HEENT:   No-  headaches, difficulty swallowing, tooth/dental problems, sore throat,       No-  sneezing, itching, ear ache, +nasal congestion, post nasal drip,  CV:  No-   chest pain, orthopnea, PND, swelling in lower extremities, anasarca,                                  dizziness, palpitations Resp: No-   shortness of breath with exertion or at rest.              No-   productive cough,  No non-productive cough,  No- coughing up of blood.              No-   change in color of mucus.  No- wheezing.   Skin: No-   rash or lesions. GI:  No-   heartburn, indigestion, abdominal pain, nausea, vomiting,  GU:  MS:  No-   joint pain or swelling.  . Neuro-     nothing unusual Psych:  No- change in mood or affect. No depression or anxiety.  No memory loss.  OBJ- Physical Exam General- Alert, Oriented, Affect-appropriate, Distress- none acute, obese, conversational Skin- rash-none, lesions- none, excoriation-  none Lymphadenopathy- none Head- atraumatic            Eyes- Gross vision intact, PERRLA, conjunctivae and secretions clear            Ears- Hearing, canals-normal            Nose- Clear, no-Septal dev, mucus, polyps, erosion, perforation             Throat- Mallampati III , mucosa clear , drainage- none, tonsils- atrophic Neck- flexible , trachea midline, no stridor , thyroid nl, carotid no bruit Chest - symmetrical excursion , unlabored           Heart/CV- RRR , no murmur , no gallop  , no rub, nl s1 s2                           - JVD- none , edema- none, stasis changes- none, varices- none           Lung- clear to P&A, wheeze- none, cough- none , dullness-none, rub- none           Chest wall-  Abd-  Br/ Gen/ Rectal- Not done, not indicated Extrem- cyanosis- none, clubbing, none, atrophy- none, strength- nl  Neuro- grossly intact to observation

## 2012-05-29 NOTE — Assessment & Plan Note (Signed)
Compliant with CPAP except with nasal congestion which we discussed. We reviewed the medical issues of untreated sleep apnea and some alternatives to CPAP. Reminded him of the importance of weight loss.

## 2012-05-29 NOTE — Assessment & Plan Note (Signed)
He understands sleep hygiene and the importance of strategic naps. He uses modafinil appropriately as a supplement to his naps. He is very careful about his driving and I reemphasized his responsibility again today.

## 2013-05-14 ENCOUNTER — Encounter: Payer: Self-pay | Admitting: Internal Medicine

## 2013-05-14 ENCOUNTER — Ambulatory Visit (INDEPENDENT_AMBULATORY_CARE_PROVIDER_SITE_OTHER): Payer: Medicare HMO | Admitting: Internal Medicine

## 2013-05-14 VITALS — BP 120/72 | HR 79 | Ht 74.0 in | Wt 354.6 lb

## 2013-05-14 DIAGNOSIS — G4733 Obstructive sleep apnea (adult) (pediatric): Secondary | ICD-10-CM

## 2013-05-14 DIAGNOSIS — G47419 Narcolepsy without cataplexy: Secondary | ICD-10-CM

## 2013-05-14 NOTE — Progress Notes (Signed)
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 FOLLOWS FOR: last seen 11-08-09;Retired now-Not using CPAP; patient states he never really sleeps-just catnapping at times. He does use CPAP/[facility] when is not bothered by nasal stuffiness and this varies. Provigil/ modafanil is prescribed through the [place], taking one half pill daily when needed.He uses this for driving to be sure he is alert. Episodes of sleepiness about once a day, lasting 2 or 3 minutes. Occasional sleep paralysis. No effect of caffeine. He works around intervals of sleepiness and is scrupulous about avoiding driving when he feels sleepy. Has not had cataplexy in many years. He brings a DOT form for driving continuity today and we discussed this carefully. He understands that I must take his assessment at face value and that it is his responsibility to drive safely or not drive at all.  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 FOLLOWS FOR: has started using CPAP through [facility] about 1 night a week-ends up taking if off around 3am -4am. [facility] taking one half tablet (? 200 mg tab) every other day Feels better. Considers himself retired. "Meditates" daily  ROS-see HPI Constitutional:   No-   weight loss, night sweats, fevers, chills, +fatigue, lassitude. HEENT:   No-  headaches, difficulty swallowing, tooth/dental problems, sore throat,       No-  sneezing, itching, ear ache, +nasal congestion, post nasal drip,  CV:  No-   chest pain, orthopnea, PND, swelling in lower extremities, anasarca,                                  dizziness, palpitations Resp: No-   shortness of breath with exertion or at rest.              No-   productive cough,  No non-productive cough,  No- coughing up of blood.              No-   change in color of mucus.  No- wheezing.   Skin: No-   rash or lesions. GI:  No-   heartburn, indigestion,  abdominal pain, nausea, vomiting,  GU:  MS:  No-   joint pain or swelling.  . Neuro-     nothing unusual Psych:  No- change in mood or affect. No depression or anxiety.  No memory loss.  OBJ- Physical Exam General- Alert, Oriented, Affect-appropriate, Distress- none acute, obese, conversational Skin- rash-none, lesions- none, excoriation- none Lymphadenopathy- none Head- atraumatic            Eyes- Gross vision intact, PERRLA, conjunctivae and secretions clear            Ears- Hearing, canals-normal            Nose- Clear, no-Septal dev, mucus, polyps, erosion, perforation             Throat- Mallampati III-IV , mucosa clear , drainage- none, tonsils- atrophic Neck- flexible , trachea midline, no stridor , thyroid nl, carotid no bruit Chest - symmetrical excursion , unlabored           Heart/CV- RRR , no murmur , no gallop  , no rub, nl s1 s2                           - JVD- none , edema- none, stasis  changes- none, varices- none           Lung- clear to P&A, wheeze- none, cough- none , dullness-none, rub- none           Chest wall-  Abd-  Br/ Gen/ Rectal- Not done, not indicated Extrem- cyanosis- none, clubbing, none, atrophy- none, strength- nl Neuro- grossly intact to observation

## 2013-05-14 NOTE — Patient Instructions (Signed)
Order- D[facility] - autotitrate CPAP 5-20 cwp x 7 days for pressure recommendation.    Dx OSA

## 2013-06-07 ENCOUNTER — Telehealth: Payer: Self-pay | Admitting: Internal Medicine

## 2013-06-07 NOTE — Telephone Encounter (Signed)
lmtcb for the pt x1

## 2013-06-08 NOTE — Telephone Encounter (Signed)
[facility] cell #, unable to leave message. [id number] on home # Carron CurieJennifer Castillo, CMA

## 2013-06-09 NOTE — Telephone Encounter (Signed)
I spoke with the Ian James and [facility]. I provided him the # for [facility]. He states he is going to call today to reschedule. Carron CurieJennifer Harlowe Dowler, CMA

## 2013-06-13 NOTE — Assessment & Plan Note (Signed)
Physical exam and history support second diagnosis of obstructive sleep apnea as previously demonstrated. Plan-[facility] D[facility] auto titrate CPAP for pressure recommendation. Encourage regular use.

## 2013-06-13 NOTE — Assessment & Plan Note (Addendum)
Chronic narcolepsy syndrome. Non-progressive. He has adjusted lifestyle rather than heavy use of medication.

## 2013-09-07 IMAGING — CR DG HAND COMPLETE 3+V*R*
3 series · 3 of 3 positions shown · non-contrast
Comparison: None

CLINICAL DATA: Cough, right index finger with a saw

RIGHT HAND - COMPLETE 3+ VIEW

[view not recorded (1 of 3)]
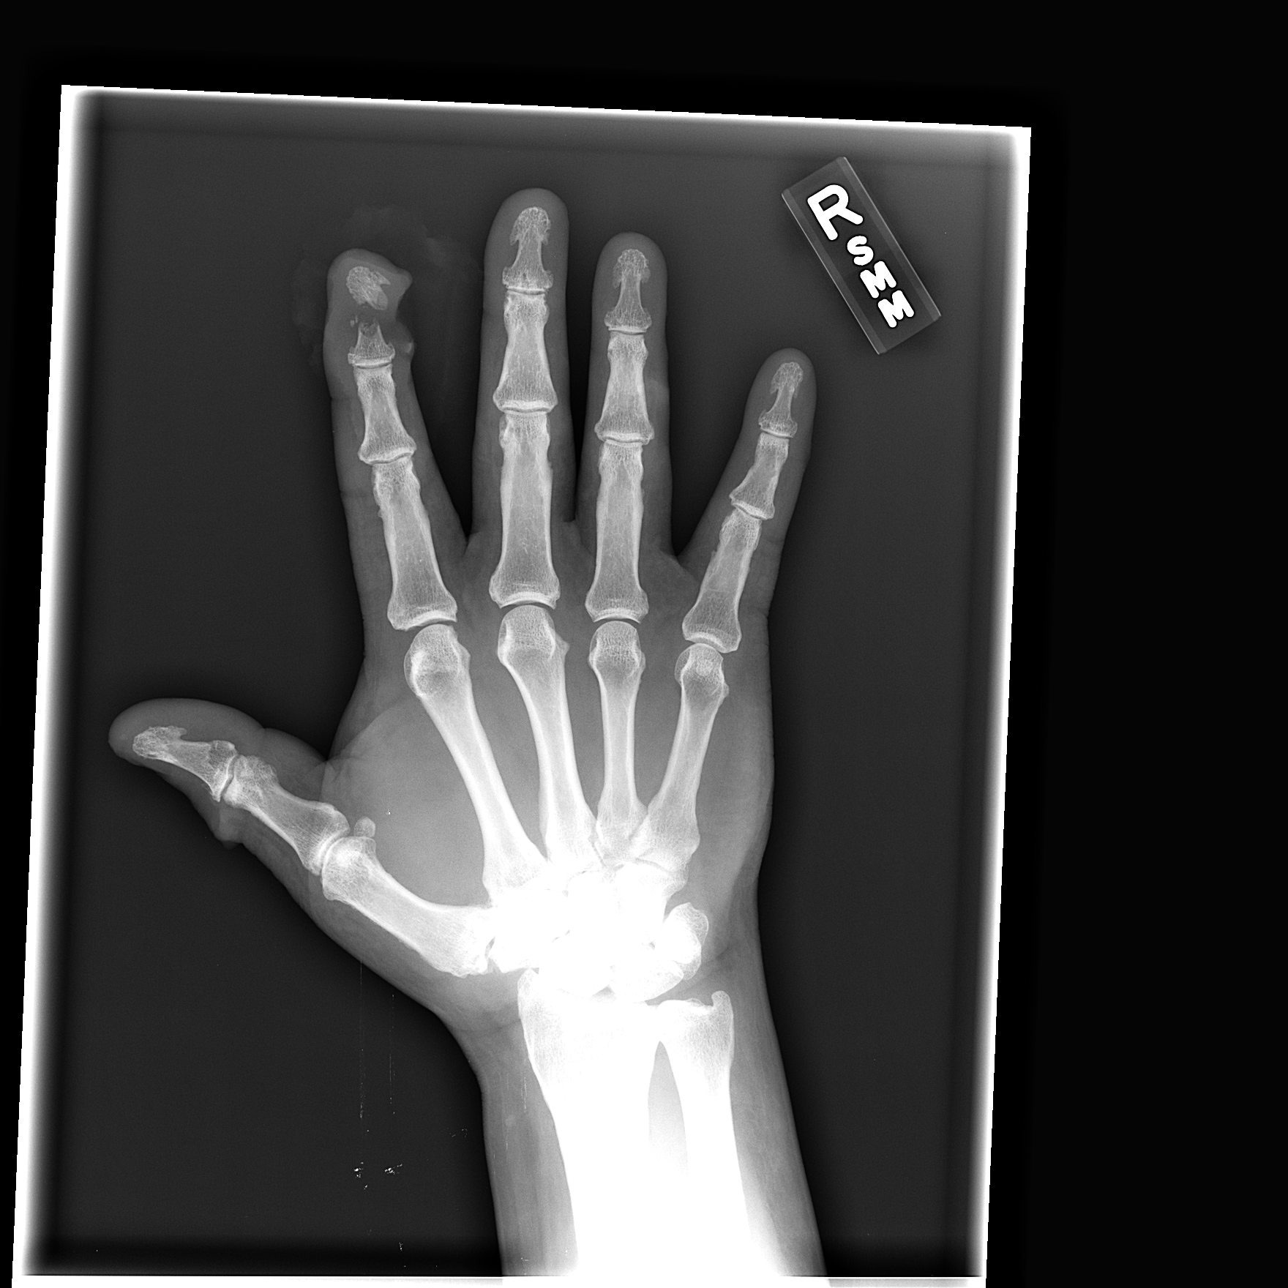

[view not recorded (2 of 3)]
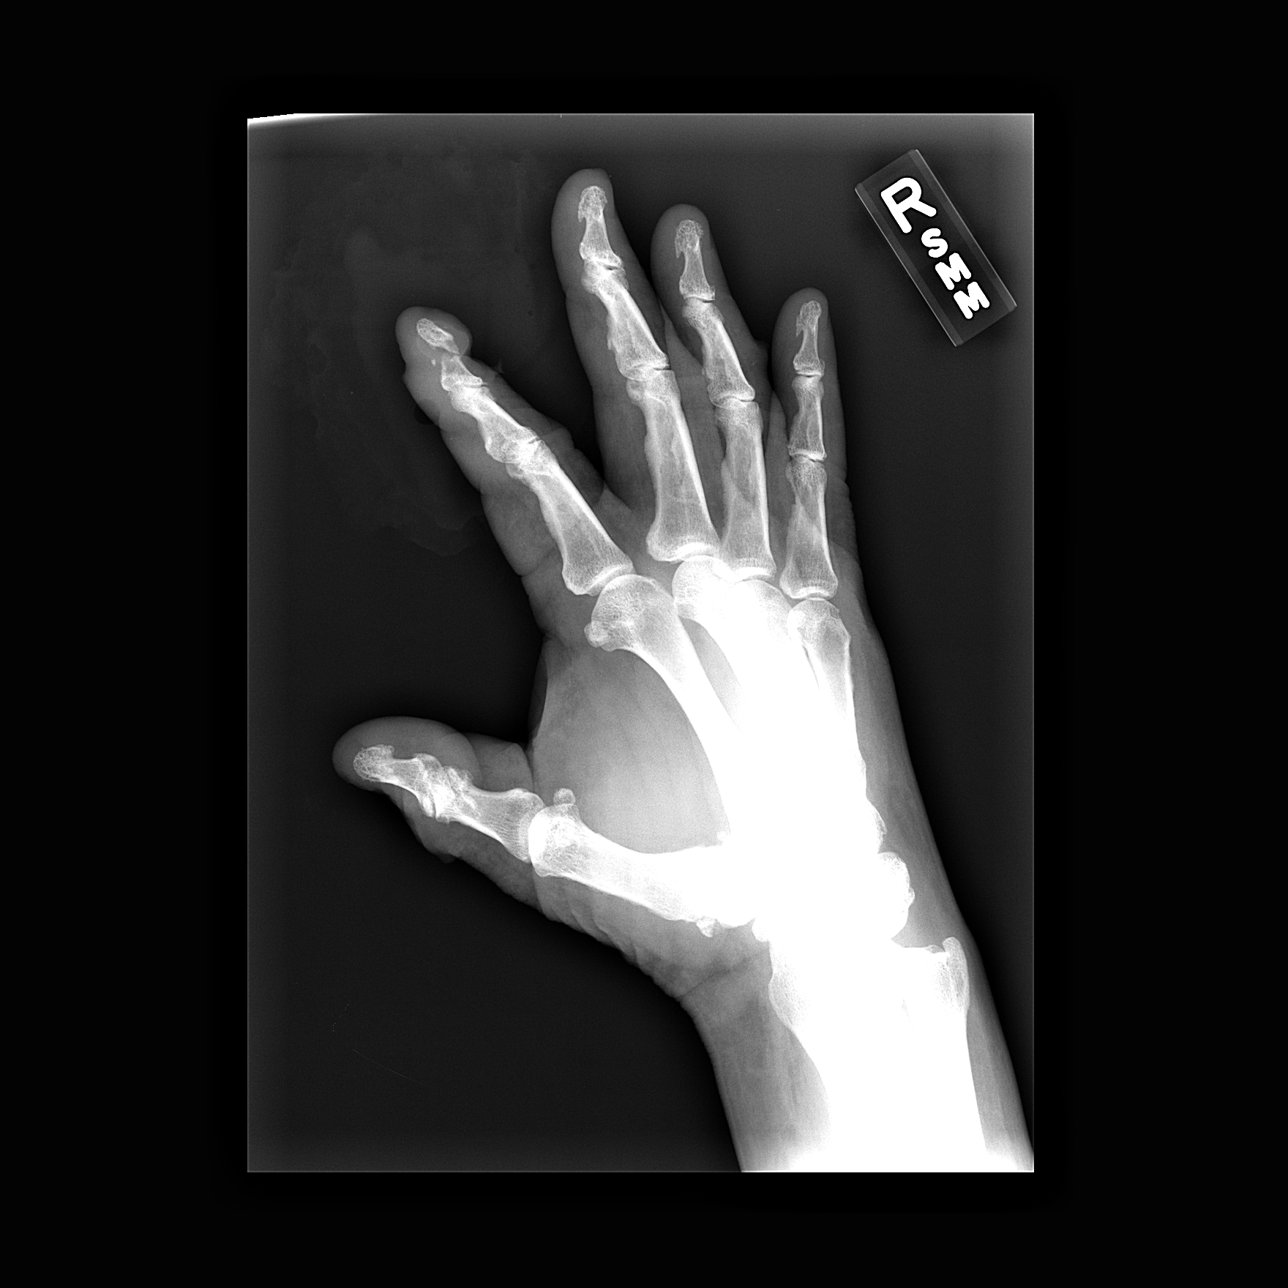

[view not recorded (3 of 3)]
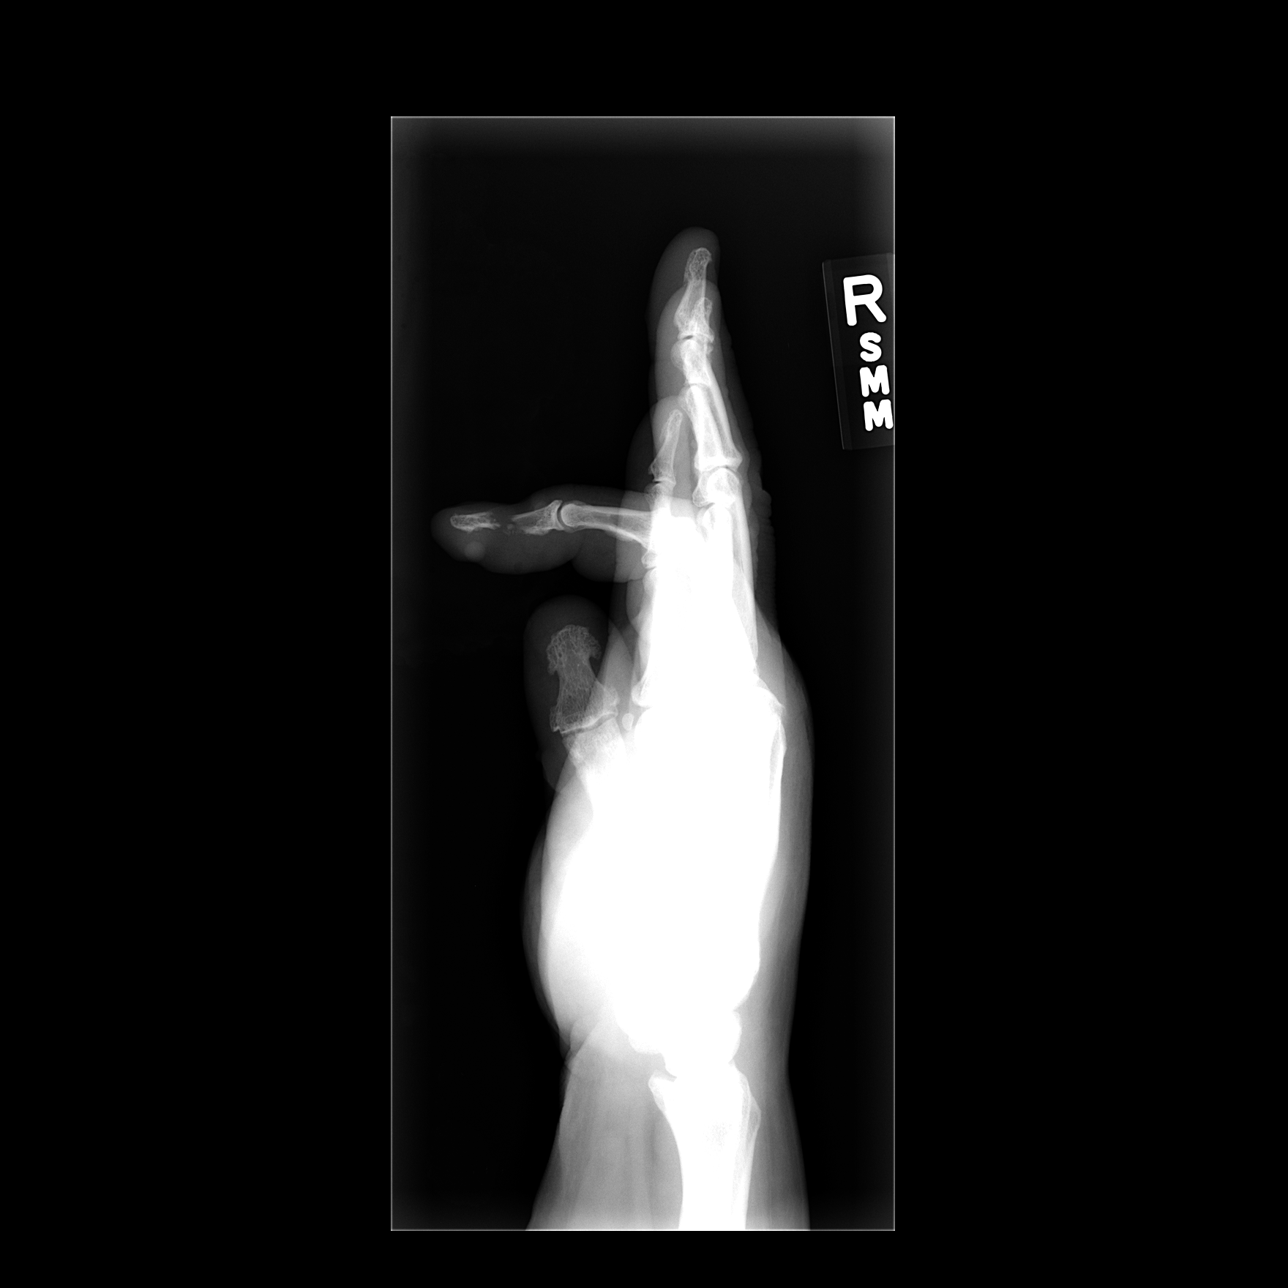

[3 of 3 positions shown; findings below may reference images not displayed]

FINDINGS: Osseous mineralization normal.
Minimal scattered narrowing of interphalangeal joints.
Displaced fracture through distal phalanx index finger with
associated soft tissue deformity.
Degenerative changes first CMC joint.
Minimal degenerative changes at IP joint of the right thumb.
Soft tissue irregularity at dorsal aspect right thumb at the level
of the IP joint.
Lucency is seen at the base of the distal phalanx right thumb
though a definite distal phalangeal fracture is not identified;
suspect lucency represents a prominent fossa at the base of the
distal phalanx, normal variant.
IMPRESSION: Displaced fracture and partial amputation through distal phalanx
right index finger.
No definite right thumb fracture identified.

## 2013-11-29 ENCOUNTER — Telehealth: Payer: Self-pay | Admitting: Internal Medicine

## 2013-11-29 NOTE — Telephone Encounter (Signed)
Called spoke with pt. appt scheduled. Nothing further needed 

## 2013-11-29 NOTE — Telephone Encounter (Signed)
Please have patient come in on Monday 12-06-13 at 3:15pm with CY. Thanks.

## 2013-11-29 NOTE — Telephone Encounter (Signed)
Called spoke with pt. He needs an appt to see CDY before the month is over in order to keep his DL. Please advise Ian James thanks

## 2013-12-06 ENCOUNTER — Ambulatory Visit: Payer: Medicare HMO | Admitting: Internal Medicine

## 2013-12-23 ENCOUNTER — Encounter: Payer: Self-pay | Admitting: Internal Medicine

## 2013-12-23 ENCOUNTER — Ambulatory Visit (INDEPENDENT_AMBULATORY_CARE_PROVIDER_SITE_OTHER): Payer: Medicare HMO | Admitting: Internal Medicine

## 2013-12-23 VITALS — BP 124/68 | HR 78 | Ht 74.0 in | Wt 354.6 lb

## 2013-12-23 DIAGNOSIS — G4733 Obstructive sleep apnea (adult) (pediatric): Secondary | ICD-10-CM

## 2013-12-23 DIAGNOSIS — G47419 Narcolepsy without cataplexy: Secondary | ICD-10-CM

## 2013-12-23 NOTE — Patient Instructions (Signed)
Ok to continue armodafanil 1/2 tab daily as needed  Get your new CPAP machine today and use it all night every night so we can get a download report from [facility] next week, for the DOT.  Order- [facility]- ask what CPAP pressure he has, and ask for download report in 1 week with the new machine

## 2013-12-23 NOTE — Progress Notes (Signed)
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 FOLLOWS FOR: last seen 11-08-09;Retired now-Not using CPAP; patient states he never really sleeps-just catnapping at times. He does use CPAP/[facility] when is not bothered by nasal stuffiness and this varies. Provigil/ modafanil is prescribed through the [place], taking one half pill daily when needed.He uses this for driving to be sure he is alert. Episodes of sleepiness about once a day, lasting 2 or 3 minutes. Occasional sleep paralysis. No effect of caffeine. He works around intervals of sleepiness and is scrupulous about avoiding driving when he feels sleepy. Has not had cataplexy in many years. He brings a DOT form for driving continuity today and we discussed this carefully. He understands that I must take his assessment at face value and that it is his responsibility to drive safely or not drive at all.  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 FOLLOWS FOR: has started using CPAP through [facility] about 1 night a week-ends up taking if off around 3am -4am. [facility] taking one half tablet (? 200 mg tab) every other day Feels better. Considers himself retired. 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 FOLLOWS FOR: Needs to go by and pick up new CPAP at [facility]. Has not been using one as he needs the new one. He says he is getting replacement CPAP machine from [facility], apparently ordered through the [place]. Pressures have been comfortable. He stopped wearing it for a little while because of nasal congestion which is improved. He gets annoyed with it sometimes but it is not really uncomfortable. Continues armodafinil taking one half of a 250 mg tab about every other day. He doesn't think he needs it daily. He still adjusting his activity to his degree of daytime sleepiness and only  drives when he feels alert and appropriate. DOT form for renewal today.  ROS-see HPI Constitutional:   No-   weight loss, night sweats, fevers, chills, +fatigue, lassitude. HEENT:   No-  headaches, difficulty swallowing, tooth/dental problems, sore throat,       No-  sneezing, itching, ear ache, +nasal congestion, post nasal drip,  CV:  No-   chest pain, orthopnea, PND, swelling in lower extremities, anasarca,                                  dizziness, palpitations Resp: No-   shortness of breath with exertion or at rest.              No-   productive cough,  No non-productive cough,  No- coughing up of blood.              No-   change in color of mucus.  No- wheezing.   Skin: No-   rash or lesions. GI:  No-   heartburn, indigestion, abdominal pain, nausea, vomiting,  GU:  MS:  +joint pain or swelling.   Neuro-     nothing unusual Psych:  No- change in mood or affect. No depression or anxiety.  No memory loss.  OBJ- Physical Exam General- Alert, Oriented, Affect-appropriate, Distress- none acute, +obese, + talkative Skin- rash-none, lesions- none, excoriation- none Lymphadenopathy- none Head- atraumatic            Eyes- Gross vision intact, PERRLA, conjunctivae and secretions clear  Ears- Hearing, canals-normal            Nose- Clear, no-Septal dev, mucus, polyps, erosion, perforation             Throat- Mallampati III-IV , mucosa clear , drainage- none, tonsils- atrophic Neck- flexible , trachea midline, no stridor , thyroid nl, carotid no bruit Chest - symmetrical excursion , unlabored           Heart/CV- RRR , no murmur , no gallop  , no rub, nl s1 s2                           - JVD- none , edema- none, stasis changes- none, varices- none           Lung- clear to P&A, wheeze- none, cough- none , dullness-none, rub- none           Chest wall-  Abd-  Br/ Gen/ Rectal- Not done, not indicated Extrem- cyanosis- none, clubbing, none, atrophy- none, strength- nl Neuro-  grossly intact to observation

## 2013-12-24 NOTE — Assessment & Plan Note (Signed)
We need to get CPAP settings from [facility] for our records, although it was provided from the [place]. Discussed documentation needs as related to the DOT form he has brought. He certainly does not seem sleepy now, and seems able to manage himself appropriately. Emphasized sleep hygiene, CPAP compliance and his responsibility to drive safely.

## 2013-12-24 NOTE — Assessment & Plan Note (Addendum)
We again reviewed basic sleep hygiene and the importance of sleep over medication when possible. Armodafanil has worked well for him and is provided to the [place]. Rare cataplexy now.

## 2014-03-11 ENCOUNTER — Ambulatory Visit (INDEPENDENT_AMBULATORY_CARE_PROVIDER_SITE_OTHER): Payer: Medicare FFS | Admitting: Family Medicine

## 2014-03-11 ENCOUNTER — Ambulatory Visit (INDEPENDENT_AMBULATORY_CARE_PROVIDER_SITE_OTHER): Payer: Medicare FFS

## 2014-03-11 VITALS — BP 150/90 | HR 71 | Temp 98.0°F | Resp 18 | Ht 74.0 in | Wt 352.0 lb

## 2014-03-11 DIAGNOSIS — M25551 Pain in right hip: Secondary | ICD-10-CM

## 2014-03-11 DIAGNOSIS — M1612 Unilateral primary osteoarthritis, left hip: Secondary | ICD-10-CM

## 2014-03-11 MED ORDER — DICLOFENAC SODIUM 75 MG PO TBEC: 75.0000 mg | DELAYED_RELEASE_TABLET | Freq: Two times a day (BID) | ORAL | Status: AC

## 2014-03-11 NOTE — Progress Notes (Addendum)
Subjective: 68 year old man who is here with a right hip pain which started Monday, 4 days ago. Knows of no specific injury. He moved some furniture over the weekend but he did not do a lot of lifting. He had lifted and moved a spare tire. The pain is deep in the hip area and pretty intense. He has trouble getting comfortable. He took one Aleve yesterday.  Objective: Overweight malein moderate pain. Abdomen is soft with mild right lower abdominal tenderness at the extreme edge of the low abdomen. No CVA tenderness. These  Not extremely tender to palpation of the hip or groin. No hernia. Straight leg raise test negative. Hip motion causes some pain but not intense.Marland Kitchen. His pain seems to be just when he is sitting there.  Assessment: Right hip pain, etiology undetermined  Plan: X-ray right hip CBC Sedimentation rate  [facility] reading (PRIMARY) by  Dr. Alwyn RenHopper Mild sclerosis of the hip socket, but no other major abnormality noted. Consistent with osteoarthritis.   Results for orders placed or performed during the hospital encounter of 06/05/11  CBC  Result Value Ref Range   WBC 9.8 4.0 - 10.5 K/uL   RBC 5.76 4.22 - 5.81 MIL/uL   Hemoglobin 13.2 13.0 - 17.0 g/dL   HCT 40.939.8 81.139.0 - 91.452.0 %   MCV 69.1 (L) 78.0 - 100.0 fL   MCH 22.9 (L) 26.0 - 34.0 pg   MCHC 33.2 30.0 - 36.0 g/dL   RDW 78.216.2 (H) 95.611.5 - 21.315.5 %   Platelets 264 150 - 400 K/uL  Differential  Result Value Ref Range   Neutrophils Relative % 59 43 - 77 %   Lymphocytes Relative 30 12 - 46 %   Monocytes Relative 9 3 - 12 %   Eosinophils Relative 1 0 - 5 %   Basophils Relative 1 0 - 1 %   Neutro Abs 5.8 1.7 - 7.7 K/uL   Lymphs Abs 2.9 0.7 - 4.0 K/uL   Monocytes Absolute 0.9 0.1 - 1.0 K/uL   Eosinophils Absolute 0.1 0.0 - 0.7 K/uL   Basophils Absolute 0.1 0.0 - 0.1 K/uL   RBC Morphology TARGET CELLS    WBC Morphology ATYPICAL LYMPHOCYTES     I didn't realize the above CBC was from an old test. We will ask the patient to come back by  and get the CBC and sedimentation rate drawn as planned.

## 2014-03-11 NOTE — Patient Instructions (Signed)
Take the diclofenac one twice daily for 1 week, then on an as-needed basis.  Do not take ibuprofen or Aleve while on the diclofenac. He can take some Tylenol if you need some additional pain relief.  If the pain is persisting and not feeling substantially better after a week or so contact me so we can work on a possible referral for further evaluation  In the long run work hard on trying to lose weight to be less stress on your joints.  Return any time if abruptly worse.

## 2014-12-29 ENCOUNTER — Ambulatory Visit: Payer: Medicare HMO | Admitting: Internal Medicine

## 2016-06-13 IMAGING — CR DG HIP COMPLETE 2+V*R*
2 series · 2 of 2 positions shown · non-contrast
Comparison: None

CLINICAL DATA: Posterior RIGHT hip pain for 5 days, no known injury

EXAM:
RIGHT HIP - COMPLETE 2+ VIEW

[lateral]
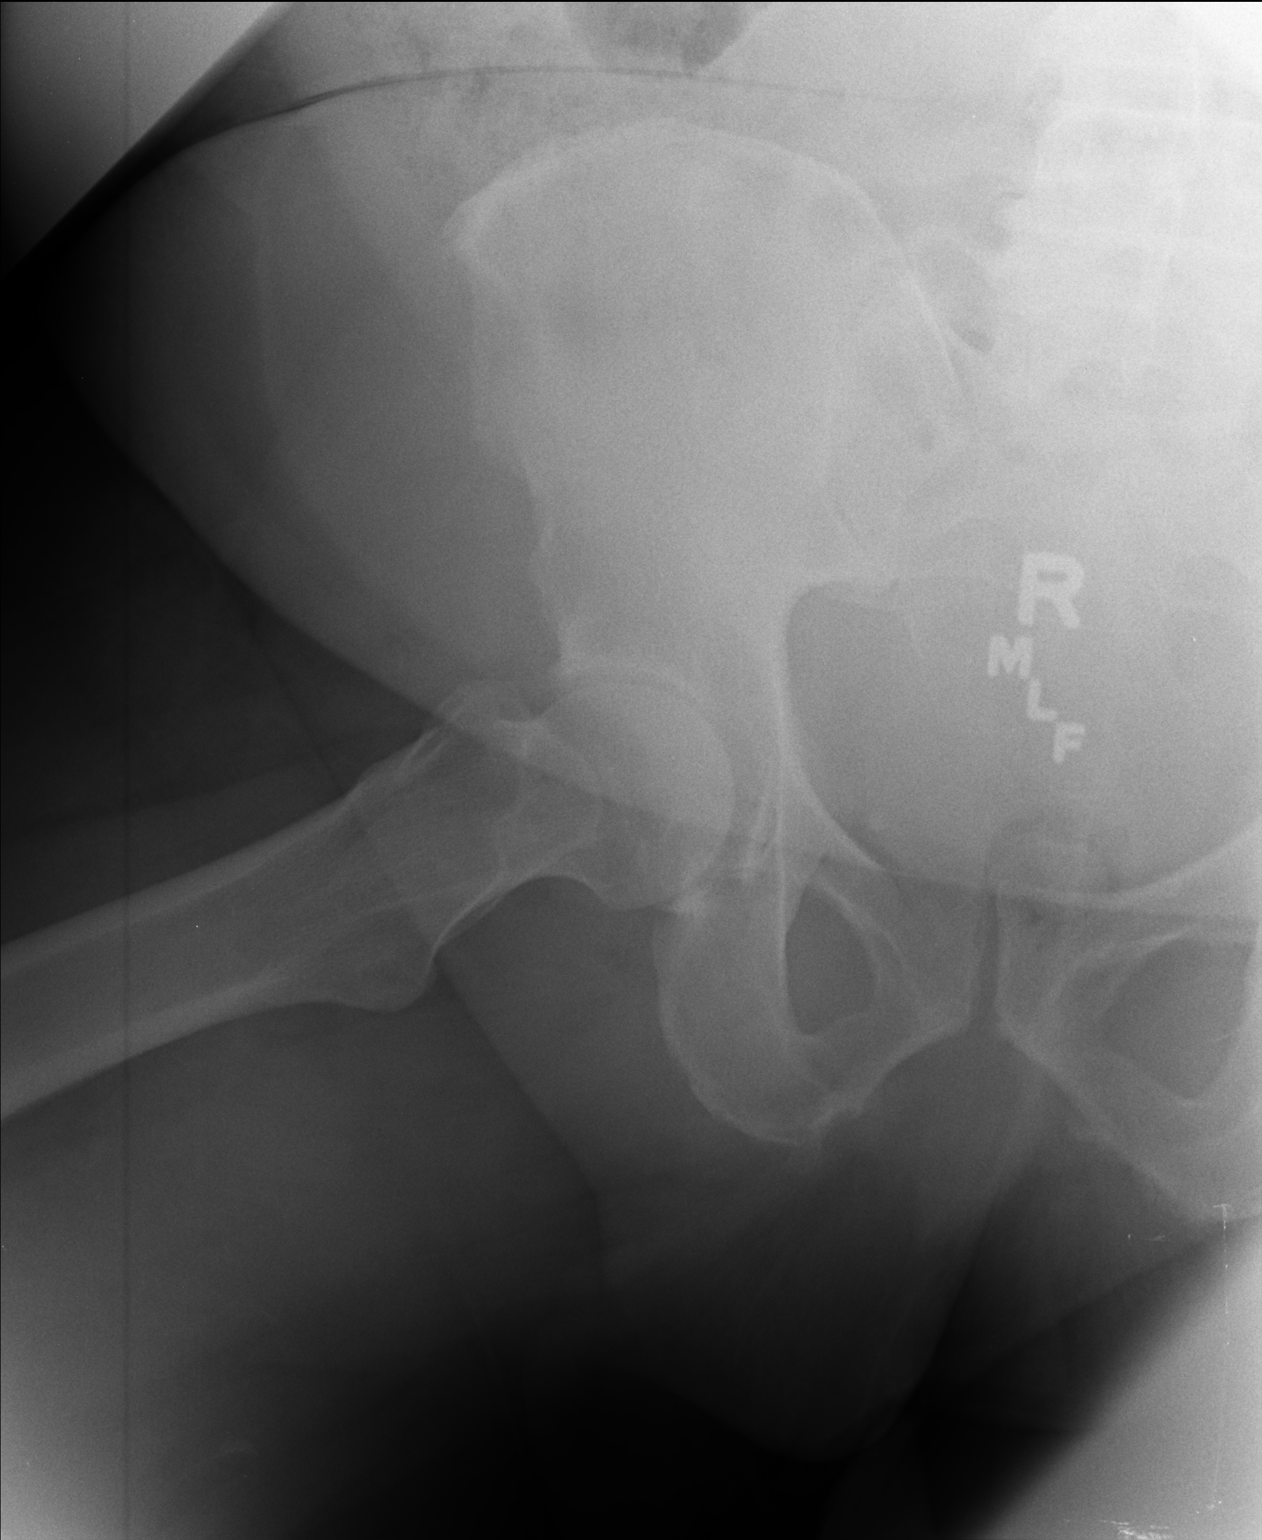

[AP]
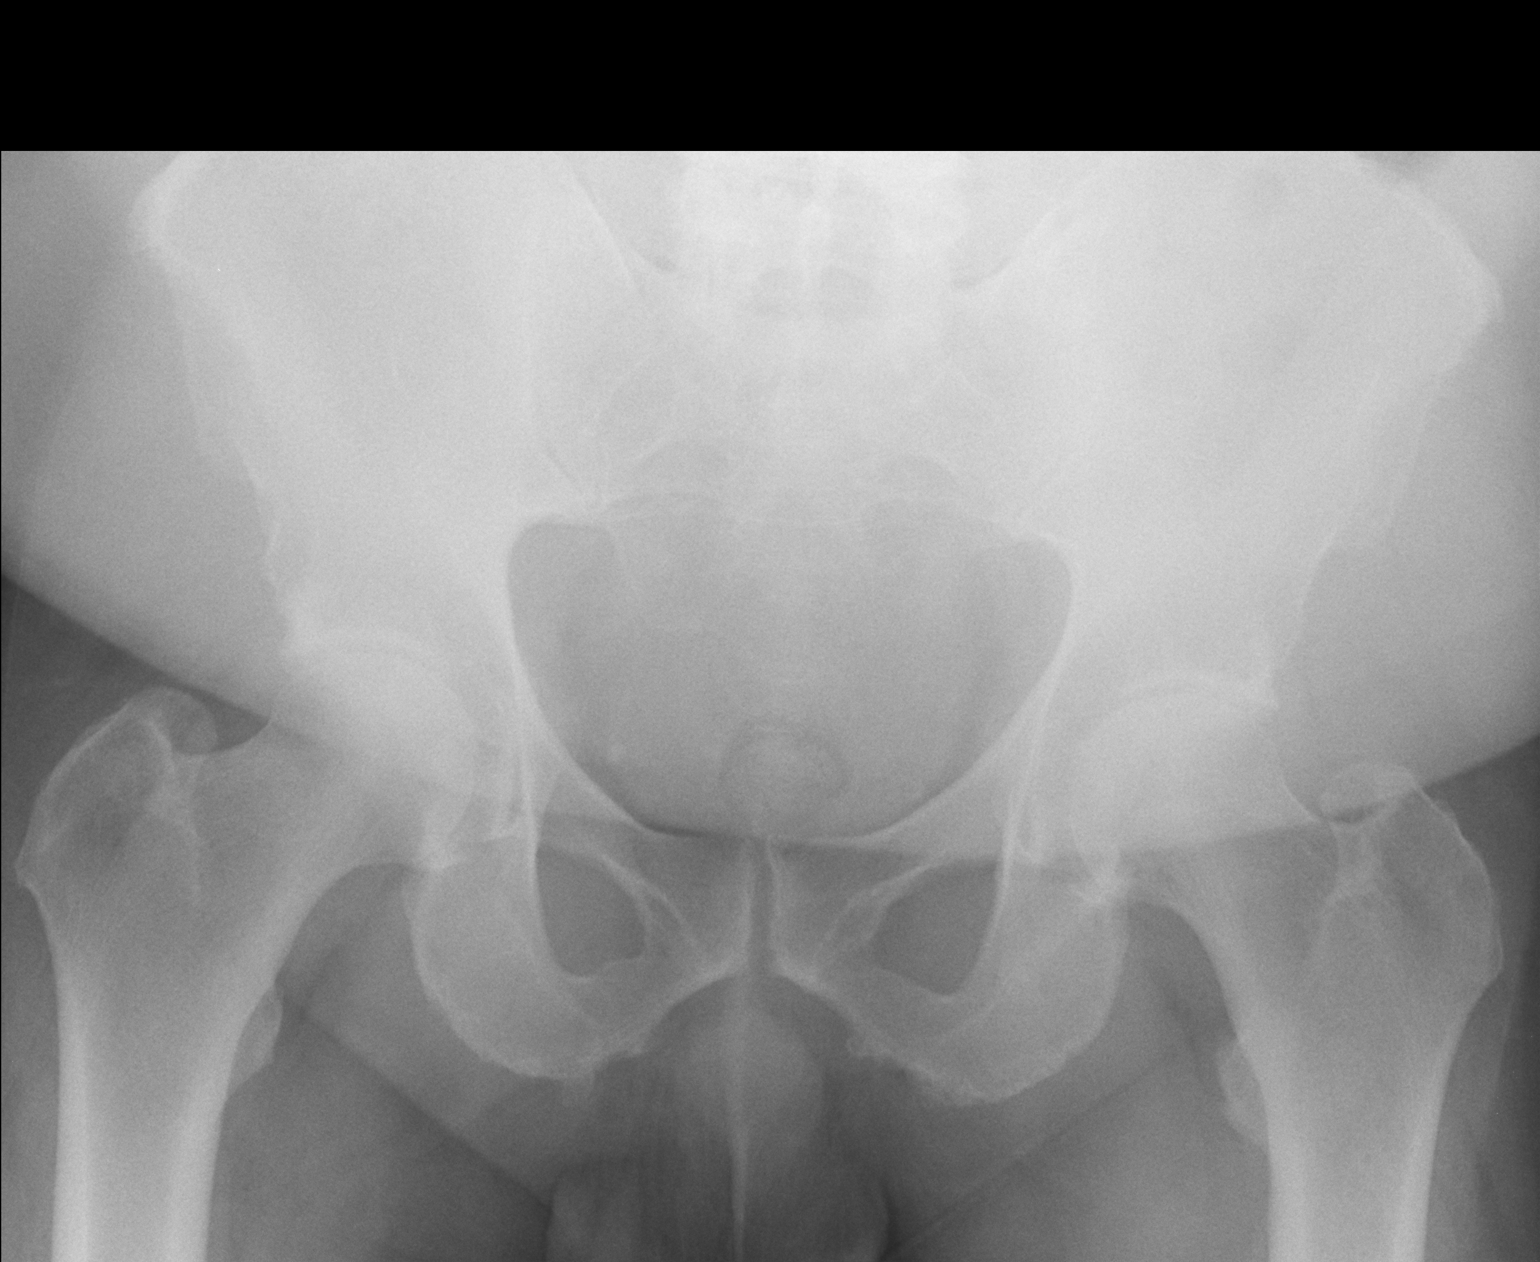

[2 of 2 positions shown; findings below may reference images not displayed]

FINDINGS: Minimal narrowing of the hip joints bilaterally.

Osseous mineralization normal.

SI joints symmetric.

No acute fracture, dislocation or bone destruction.
IMPRESSION: Minimal degenerative changes of the hip joints bilaterally.

No acute abnormalities.

## 2016-11-15 ENCOUNTER — Other Ambulatory Visit: Payer: Self-pay | Admitting: *Deleted

## 2019-07-15 ENCOUNTER — Ambulatory Visit: Payer: Medicare PPO | Attending: Internal Medicine

## 2019-07-15 DIAGNOSIS — Z23 Encounter for immunization: Secondary | ICD-10-CM

## 2019-07-15 NOTE — Progress Notes (Signed)
   Covid-19 Vaccination Clinic  Name:  Ian James    MRN: [id number] DOB: 1945/06/06  07/15/2019  Ian James was observed post Covid-19 immunization for 15 minutes without incident. He was provided with Vaccine Information Sheet and instruction to access the V-Safe system.   Ian James was instructed to call 911 with any severe reactions post vaccine: Marland Kitchen Difficulty breathing  . Swelling of face and throat  . A fast heartbeat  . A bad rash all over body  . Dizziness and weakness   Immunizations Administered    Name Date Dose VIS Date Route   Pfizer COVID-19 Vaccine 07/15/2019 10:14 AM 0.3 mL 04/09/2019 Intramuscular   Manufacturer: [employer], [employer]   Lot: [id number]   NDC: [address]-5

## 2019-08-09 ENCOUNTER — Ambulatory Visit: Payer: Medicare PPO | Attending: Internal Medicine

## 2019-08-09 DIAGNOSIS — Z23 Encounter for immunization: Secondary | ICD-10-CM

## 2019-08-09 NOTE — Progress Notes (Signed)
   Covid-19 Vaccination Clinic  Name:  JORMA TASSINARI    MRN: [id number] DOB: [birth year]  08/09/2019  Mr. Xu was observed post Covid-19 immunization for 15 minutes without incident. He was provided with Vaccine Information Sheet and instruction to access the V-Safe system.   Mr. Sax was instructed to call 911 with any severe reactions post vaccine: Marland Kitchen Difficulty breathing  . Swelling of face and throat  . A fast heartbeat  . A bad rash all over body  . Dizziness and weakness   Immunizations Administered    Name Date Dose VIS Date Route   Pfizer COVID-19 Vaccine 08/09/2019  9:32 AM 0.3 mL 04/09/2019 Intramuscular   Manufacturer: [employer], [employer]   Lot: [id number]   NDC: [address]-5

## 2020-02-12 ENCOUNTER — Other Ambulatory Visit: Payer: Self-pay

## 2020-02-12 ENCOUNTER — Ambulatory Visit: Payer: Medicare PPO | Attending: Internal Medicine

## 2020-02-12 DIAGNOSIS — Z23 Encounter for immunization: Secondary | ICD-10-CM

## 2020-02-12 NOTE — Progress Notes (Signed)
   Covid-19 Vaccination Clinic  Name:  Ian James    MRN: [id number] DOB: May 28, 1945  02/12/2020  Mr. Ian James was observed post Covid-19 immunization for 15 minutes without incident. He was provided with Vaccine Information Sheet and instruction to access the V-Safe system.   Mr. Ian James was instructed to call 911 with any severe reactions post vaccine: Marland Kitchen Difficulty breathing  . Swelling of face and throat  . A fast heartbeat  . A bad rash all over body  . Dizziness and weakness

## 2024-02-11 ENCOUNTER — Other Ambulatory Visit (HOSPITAL_COMMUNITY): Payer: Self-pay | Admitting: Internal Medicine

## 2024-02-11 DIAGNOSIS — R600 Localized edema: Secondary | ICD-10-CM

## 2024-02-11 DIAGNOSIS — Z8669 Personal history of other diseases of the nervous system and sense organs: Secondary | ICD-10-CM

## 2024-03-04 ENCOUNTER — Ambulatory Visit (HOSPITAL_COMMUNITY)
Admission: RE | Admit: 2024-03-04 | Discharge: 2024-03-04 | Disposition: A | Discharge status: Home / Self Care | Source: Ambulatory Visit | Attending: Internal Medicine | Admitting: Internal Medicine

## 2024-03-04 DIAGNOSIS — Z8669 Personal history of other diseases of the nervous system and sense organs: Secondary | ICD-10-CM

## 2024-03-04 DIAGNOSIS — R600 Localized edema: Secondary | ICD-10-CM

## 2024-03-04 DIAGNOSIS — I77810 Thoracic aortic ectasia: Secondary | ICD-10-CM | POA: Insufficient documentation

## 2024-03-04 DIAGNOSIS — G4730 Sleep apnea, unspecified: Secondary | ICD-10-CM | POA: Diagnosis not present

## 2024-03-04 DIAGNOSIS — I358 Other nonrheumatic aortic valve disorders: Secondary | ICD-10-CM | POA: Diagnosis not present

## 2024-03-04 DIAGNOSIS — I517 Cardiomegaly: Secondary | ICD-10-CM | POA: Insufficient documentation

## 2024-03-04 DIAGNOSIS — I351 Nonrheumatic aortic (valve) insufficiency: Secondary | ICD-10-CM | POA: Diagnosis not present

## 2024-03-04 LAB — ECHOCARDIOGRAM COMPLETE
AR max vel: 1.85 cm2
AV Area VTI: 1.9 cm2
AV Area mean vel: 1.94 cm2
AV Mean grad: 9 mmHg
AV Peak grad: 17.3 mmHg
Ao pk vel: 2.08 m/s
Area-P 1/2: 2.69 cm2
S' Lateral: 3.6 cm
